# Patient Record
Sex: Female | Born: 1945 | Race: White | Hispanic: No | Marital: Married | Smoking: Never smoker
Health system: Southern US, Community
[De-identification: ages and names within clinical notes are randomized; demographics above are authoritative.]

## PROBLEM LIST (undated history)

## (undated) DIAGNOSIS — I4891 Unspecified atrial fibrillation: Secondary | ICD-10-CM

## (undated) DIAGNOSIS — M810 Age-related osteoporosis without current pathological fracture: Secondary | ICD-10-CM

## (undated) DIAGNOSIS — D649 Anemia, unspecified: Secondary | ICD-10-CM

## (undated) DIAGNOSIS — M1990 Unspecified osteoarthritis, unspecified site: Secondary | ICD-10-CM

## (undated) DIAGNOSIS — I499 Cardiac arrhythmia, unspecified: Secondary | ICD-10-CM

## (undated) HISTORY — PX: DILATION AND CURETTAGE OF UTERUS: SHX78

## (undated) HISTORY — PX: TONSILLECTOMY: SUR1361

## (undated) HISTORY — PX: FRACTURE SURGERY: SHX138

## (undated) HISTORY — PX: BREAST SURGERY: SHX581

## (undated) HISTORY — PX: LAPAROSCOPIC OOPHERECTOMY: SHX6507

---

## 2015-02-17 ENCOUNTER — Emergency Department: Payer: Medicare Other

## 2015-02-17 ENCOUNTER — Observation Stay
Admission: EM | Admit: 2015-02-17 | Discharge: 2015-02-20 | Disposition: A | Discharge status: Home / Self Care | Payer: Medicare Other | Attending: Orthopedic Surgery | Admitting: Orthopedic Surgery

## 2015-02-17 ENCOUNTER — Encounter: Payer: Self-pay | Admitting: Emergency Medicine

## 2015-02-17 DIAGNOSIS — G8918 Other acute postprocedural pain: Secondary | ICD-10-CM

## 2015-02-17 DIAGNOSIS — S0083XA Contusion of other part of head, initial encounter: Secondary | ICD-10-CM | POA: Diagnosis present

## 2015-02-17 DIAGNOSIS — I10 Essential (primary) hypertension: Secondary | ICD-10-CM | POA: Insufficient documentation

## 2015-02-17 DIAGNOSIS — S82001A Unspecified fracture of right patella, initial encounter for closed fracture: Secondary | ICD-10-CM

## 2015-02-17 DIAGNOSIS — S52022A Displaced fracture of olecranon process without intraarticular extension of left ulna, initial encounter for closed fracture: Principal | ICD-10-CM | POA: Insufficient documentation

## 2015-02-17 DIAGNOSIS — W19XXXA Unspecified fall, initial encounter: Secondary | ICD-10-CM | POA: Diagnosis not present

## 2015-02-17 DIAGNOSIS — M25531 Pain in right wrist: Secondary | ICD-10-CM

## 2015-02-17 DIAGNOSIS — I4891 Unspecified atrial fibrillation: Secondary | ICD-10-CM | POA: Diagnosis not present

## 2015-02-17 DIAGNOSIS — Z7982 Long term (current) use of aspirin: Secondary | ICD-10-CM | POA: Insufficient documentation

## 2015-02-17 DIAGNOSIS — R040 Epistaxis: Secondary | ICD-10-CM | POA: Insufficient documentation

## 2015-02-17 DIAGNOSIS — Z419 Encounter for procedure for purposes other than remedying health state, unspecified: Secondary | ICD-10-CM

## 2015-02-17 DIAGNOSIS — S52023A Displaced fracture of olecranon process without intraarticular extension of unspecified ulna, initial encounter for closed fracture: Secondary | ICD-10-CM | POA: Diagnosis present

## 2015-02-17 HISTORY — DX: Age-related osteoporosis without current pathological fracture: M81.0

## 2015-02-17 NOTE — ED Notes (Signed)
Pt to triage via w/c with no distress noted; reports PTA tripped over dogleash, falling; c/o pain to left elbow, right knee; small area bruising to nose--denies pain

## 2015-02-17 NOTE — ED Provider Notes (Signed)
[facility] Emergency Department Provider Note  ____________________________________________  Time seen: Approximately 11:14 PM  I have reviewed the triage vital signs and the nursing notes.   HISTORY  Chief Complaint Fall    HPI Rebecca Page is a 70 y.o. female presents for evaluation of multiple complaints secondary to a fall prior to arrival. Patient reports that she tripped over her dog leash landing on her left elbow, right knee, and hitting her face. Reports that her nose bled copiously and has some bruising around the nose.   Past Medical History  Diagnosis Date  . Osteoporosis     There are no active problems to display for this patient.   Past Surgical History  Procedure Laterality Date  . Laparoscopic oopherectomy      No current outpatient prescriptions on file.  Allergies Gluten meal  No family history on file.  Social History Social History  Substance Use Topics  . Smoking status: Never Smoker   . Smokeless tobacco: Not on file  . Alcohol Use: No    Review of Systems Constitutional: No fever/chills Eyes: No visual changes. ENT: No sore throat. Cardiovascular: Denies chest pain. Respiratory: Denies shortness of breath. Gastrointestinal: No abdominal pain.  No nausea, no vomiting.  No diarrhea.  No constipation. Genitourinary: Negative for dysuria. Musculoskeletal: Negative for back pain. Skin: Negative for rash. Neurological: Negative for headaches, focal weakness or numbness.  10-point ROS otherwise negative.  ____________________________________________   PHYSICAL EXAM:  VITAL SIGNS: ED Triage Vitals  Enc Vitals Group     BP 02/17/15 2226 170/73 mmHg     Pulse Rate 02/17/15 2226 86     Resp 02/17/15 2226 18     Temp 02/17/15 2226 98.1 F (36.7 C)     Temp Source 02/17/15 2226 Oral     SpO2 02/17/15 2226 98 %     Weight 02/17/15 2226 112 lb (50.803 kg)     Height 02/17/15 2226 5\' 2"  (1.575 m)     Head  Cir --      Peak Flow --      Pain Score 02/17/15 2225 8     Pain Loc --      Pain Edu? --      Excl. in GC? --     Constitutional: Alert and oriented. Well appearing and in no acute distress. Eyes: Conjunctivae are normal. PERRL. EOMI. Head: Facial trauma noted. Nose: Positive edema with ecchymosis surrounding the nasal bridge. Minimal tenderness positive dried blood noted within the naris left greater than right. Mouth/Throat: Mucous membranes are moist.  Oropharynx non-erythematous. Neck: No stridor. Nontender to palpation cervically.  Cardiovascular: Normal rate, regular rhythm. Grossly normal heart sounds.  Good peripheral circulation. Respiratory: Normal respiratory effort.  No retractions. Lungs CTAB Musculoskeletal: Left elbow with edema and mild deformity. Point tenderness noted. Limited range of motion of the forearm. Right knee with point tenderness both medial and lateral aspect. No ecchymosis or edema noted. Able to ambulate however with a limp. Distal neurovascularly intact left upper extremity and right lower extremity. Neurologic:  Normal speech and language. No gross focal neurologic deficits are appreciated. No gait instability. Skin:  Skin is warm, dry and intact. No rash noted. Psychiatric: Mood and affect are normal. Speech and behavior are normal.  ____________________________________________   LABS (all labs ordered are listed, but only abnormal results are displayed)  Labs Reviewed - No data to display ____________________________________________   RADIOLOGY  Maxillofacial CT:      1. IMPRESSION:  No definite acute fracture.   FINDINGS: The nondisplaced fractures of the patella. No other fracture identified. There is no dislocation. The bones are osteopenic. Small suprapatellar effusion. Soft tissues unremarkable.      2. IMPRESSION: Nondisplaced patellar fractures.  FINDINGS: The bones are osteopenic. There is a displaced fracture of  the olecranon process the ulna. The visualized radius and humerus appear unremarkable. There is soft tissue swelling and hematoma of the posterior elbow. There is elevation of the anterior and posterior fat pad compatible with joint effusion.      3. IMPRESSION: Displaced fracture of the olecranon process of the ulna.   ____________________________________________   PROCEDURES  Procedure(s) performed: None  Critical Care performed: No  ____________________________________________   INITIAL IMPRESSION / ASSESSMENT AND PLAN / ED COURSE  Pertinent labs & imaging results that were available during my care of the patient were reviewed by me and considered in my medical decision making (see chart for details).  Olecranon fracture and patella fracture. Spoke with attending orthopedic on call, Dr. Rosita Kea. Patient to be admitted with orders written by hospitalist. ____________________________________________   FINAL CLINICAL IMPRESSION(S) / ED DIAGNOSES  Final diagnoses:  Patella fracture, right, closed, initial encounter  Olecranon fracture, left, closed, initial encounter  Facial contusion, initial encounter      Evangeline Dakin, PA-C 02/18/15 1610  Sharyn Creamer, MD 02/18/15 [phone]

## 2015-02-18 ENCOUNTER — Observation Stay: Payer: Medicare Other | Admitting: Anesthesiology

## 2015-02-18 ENCOUNTER — Encounter
Admission: EM | Disposition: A | Discharge status: Home / Self Care | Payer: Self-pay | Source: Home / Self Care | Attending: Orthopedic Surgery

## 2015-02-18 ENCOUNTER — Observation Stay: Payer: Medicare Other

## 2015-02-18 ENCOUNTER — Inpatient Hospital Stay: Payer: Medicare Other

## 2015-02-18 DIAGNOSIS — S52022A Displaced fracture of olecranon process without intraarticular extension of left ulna, initial encounter for closed fracture: Secondary | ICD-10-CM | POA: Diagnosis not present

## 2015-02-18 DIAGNOSIS — S52023A Displaced fracture of olecranon process without intraarticular extension of unspecified ulna, initial encounter for closed fracture: Secondary | ICD-10-CM | POA: Diagnosis present

## 2015-02-18 HISTORY — PX: ORIF ELBOW FRACTURE: SHX5031

## 2015-02-18 LAB — CBC
HCT: 38.2 % (ref 35.0–47.0)
HEMOGLOBIN: 12.9 g/dL (ref 12.0–16.0)
MCH: 31.1 pg (ref 26.0–34.0)
MCHC: 33.8 g/dL (ref 32.0–36.0)
MCV: 92 fL (ref 80.0–100.0)
PLATELETS: 134 10*3/uL — AB (ref 150–440)
RBC: 4.15 MIL/uL (ref 3.80–5.20)
RDW: 13 % (ref 11.5–14.5)
WBC: 7.5 10*3/uL (ref 3.6–11.0)

## 2015-02-18 LAB — BASIC METABOLIC PANEL
Anion gap: 7 (ref 5–15)
BUN: 22 mg/dL — AB (ref 6–20)
CALCIUM: 8.7 mg/dL — AB (ref 8.9–10.3)
CHLORIDE: 107 mmol/L (ref 101–111)
CO2: 26 mmol/L (ref 22–32)
CREATININE: 0.49 mg/dL (ref 0.44–1.00)
GFR calc non Af Amer: >60 mL/min (ref >60)
Glucose, Bld: 121 mg/dL — ABNORMAL HIGH (ref 65–99)
Potassium: 3.8 mmol/L (ref 3.5–5.1)
SODIUM: 140 mmol/L (ref 135–145)

## 2015-02-18 LAB — SURGICAL PCR SCREEN
MRSA, PCR: NEGATIVE
Staphylococcus aureus: NEGATIVE

## 2015-02-18 SURGERY — OPEN REDUCTION INTERNAL FIXATION (ORIF) ELBOW/OLECRANON FRACTURE
Anesthesia: General | Laterality: Left

## 2015-02-18 MED ORDER — VITAMIN D 1000 UNITS PO TABS
1000.0000 [IU] | ORAL_TABLET | Freq: Every day | ORAL | Status: DC
  Administered 2015-02-18 – 2015-02-20 (×3): 1000 [IU] via ORAL
  Filled 2015-02-18 (×3): qty 1

## 2015-02-18 MED ORDER — ACETAMINOPHEN 650 MG RE SUPP: 650.0000 mg | Freq: Four times a day (QID) | RECTAL | Status: DC | PRN

## 2015-02-18 MED ORDER — DEXAMETHASONE SODIUM PHOSPHATE 10 MG/ML IJ SOLN
INTRAMUSCULAR | Status: DC | PRN
  Administered 2015-02-18: 10 mg via INTRAVENOUS

## 2015-02-18 MED ORDER — FENTANYL CITRATE (PF) 100 MCG/2ML IJ SOLN
25.0000 ug | INTRAMUSCULAR | Status: AC | PRN
  Administered 2015-02-18 (×6): 25 ug via INTRAVENOUS

## 2015-02-18 MED ORDER — ACETAMINOPHEN 325 MG PO TABS
650.0000 mg | ORAL_TABLET | Freq: Four times a day (QID) | ORAL | Status: DC | PRN
  Administered 2015-02-19 – 2015-02-20 (×3): 650 mg via ORAL
  Filled 2015-02-18 (×3): qty 2

## 2015-02-18 MED ORDER — FLECAINIDE ACETATE 50 MG PO TABS
75.0000 mg | ORAL_TABLET | Freq: Two times a day (BID) | ORAL | Status: DC
  Administered 2015-02-18 – 2015-02-19 (×4): 75 mg via ORAL
  Administered 2015-02-20: 100 mg via ORAL
  Filled 2015-02-18 (×5): qty 2

## 2015-02-18 MED ORDER — FLECAINIDE ACETATE 100 MG PO TABS: 300.0000 mg | ORAL_TABLET | Freq: Two times a day (BID) | ORAL | Status: DC

## 2015-02-18 MED ORDER — MORPHINE SULFATE (PF) 2 MG/ML IV SOLN
2.0000 mg | INTRAVENOUS | Status: DC | PRN
  Administered 2015-02-18 – 2015-02-19 (×3): 2 mg via INTRAVENOUS
  Filled 2015-02-18 (×3): qty 1

## 2015-02-18 MED ORDER — METOCLOPRAMIDE HCL 5 MG/ML IJ SOLN
5.0000 mg | Freq: Three times a day (TID) | INTRAMUSCULAR | Status: DC | PRN
  Administered 2015-02-19: 10 mg via INTRAVENOUS
  Filled 2015-02-18: qty 2

## 2015-02-18 MED ORDER — MIDAZOLAM HCL 2 MG/2ML IJ SOLN
INTRAMUSCULAR | Status: DC | PRN
  Administered 2015-02-18: 2 mg via INTRAVENOUS

## 2015-02-18 MED ORDER — FENTANYL CITRATE (PF) 100 MCG/2ML IJ SOLN
INTRAMUSCULAR | Status: AC
  Filled 2015-02-18: qty 2

## 2015-02-18 MED ORDER — LACTATED RINGERS IV SOLN
INTRAVENOUS | Status: DC | PRN
  Administered 2015-02-18: 18:00:00 via INTRAVENOUS

## 2015-02-18 MED ORDER — OXYCODONE HCL 5 MG PO TABS
5.0000 mg | ORAL_TABLET | ORAL | Status: DC | PRN
  Administered 2015-02-18 – 2015-02-19 (×2): 5 mg via ORAL
  Administered 2015-02-19: 10 mg via ORAL
  Filled 2015-02-18: qty 1
  Filled 2015-02-18: qty 2
  Filled 2015-02-18: qty 1

## 2015-02-18 MED ORDER — ONDANSETRON HCL 4 MG/2ML IJ SOLN
4.0000 mg | Freq: Four times a day (QID) | INTRAMUSCULAR | Status: DC | PRN
  Administered 2015-02-18 – 2015-02-19 (×2): 4 mg via INTRAVENOUS
  Filled 2015-02-18 (×3): qty 2

## 2015-02-18 MED ORDER — CEFAZOLIN SODIUM 1-5 GM-% IV SOLN
1.0000 g | Freq: Four times a day (QID) | INTRAVENOUS | Status: AC
  Administered 2015-02-19 (×3): 1 g via INTRAVENOUS
  Filled 2015-02-18 (×3): qty 50

## 2015-02-18 MED ORDER — FENTANYL CITRATE (PF) 100 MCG/2ML IJ SOLN
INTRAMUSCULAR | Status: DC | PRN
  Administered 2015-02-18 (×2): 50 ug via INTRAVENOUS

## 2015-02-18 MED ORDER — METOCLOPRAMIDE HCL 5 MG PO TABS: 5.0000 mg | ORAL_TABLET | Freq: Three times a day (TID) | ORAL | Status: DC | PRN

## 2015-02-18 MED ORDER — CEFAZOLIN SODIUM-DEXTROSE 2-3 GM-% IV SOLR
2.0000 g | INTRAVENOUS | Status: AC
  Administered 2015-02-18: 2 g via INTRAVENOUS
  Filled 2015-02-18: qty 50

## 2015-02-18 MED ORDER — SODIUM CHLORIDE 0.9 % IV SOLN
INTRAVENOUS | Status: DC
  Administered 2015-02-18 – 2015-02-20 (×4): via INTRAVENOUS

## 2015-02-18 MED ORDER — CEFAZOLIN SODIUM 1-5 GM-% IV SOLN: 1.0000 g | Freq: Once | INTRAVENOUS | Status: DC

## 2015-02-18 MED ORDER — ONDANSETRON HCL 4 MG/2ML IJ SOLN
INTRAMUSCULAR | Status: DC | PRN
  Administered 2015-02-18: 4 mg via INTRAVENOUS

## 2015-02-18 MED ORDER — ASPIRIN EC 81 MG PO TBEC
81.0000 mg | DELAYED_RELEASE_TABLET | Freq: Every day | ORAL | Status: DC
  Administered 2015-02-18 – 2015-02-20 (×3): 81 mg via ORAL
  Filled 2015-02-18 (×3): qty 1

## 2015-02-18 MED ORDER — SODIUM CHLORIDE 0.9 % IV BOLUS (SEPSIS)
1000.0000 mL | Freq: Once | INTRAVENOUS | Status: AC
  Administered 2015-02-18: 1000 mL via INTRAVENOUS

## 2015-02-18 MED ORDER — NEOMYCIN-POLYMYXIN B GU 40-200000 IR SOLN
Status: DC | PRN
  Administered 2015-02-18: 2 mL

## 2015-02-18 MED ORDER — ONDANSETRON HCL 4 MG/2ML IJ SOLN: 4.0000 mg | Freq: Once | INTRAMUSCULAR | Status: DC | PRN

## 2015-02-18 MED ORDER — PROPOFOL 10 MG/ML IV BOLUS
INTRAVENOUS | Status: DC | PRN
  Administered 2015-02-18: 160 mg via INTRAVENOUS

## 2015-02-18 MED ORDER — MORPHINE SULFATE (PF) 2 MG/ML IV SOLN
2.0000 mg | Freq: Once | INTRAVENOUS | Status: AC
  Administered 2015-02-18: 2 mg via INTRAVENOUS
  Filled 2015-02-18: qty 1

## 2015-02-18 MED ORDER — PHENYLEPHRINE HCL 10 MG/ML IJ SOLN
INTRAMUSCULAR | Status: DC | PRN
  Administered 2015-02-18 (×2): 100 ug via INTRAVENOUS
  Administered 2015-02-18: 50 ug via INTRAVENOUS

## 2015-02-18 SURGICAL SUPPLY — 55 items
BANDAGE ELASTIC 4 CLIP NS LF (GAUZE/BANDAGES/DRESSINGS) ×4 IMPLANT
BIT DRILL 2.5X2.75 QC CALB (BIT) ×2 IMPLANT
BIT DRILL CALIBRATED 2.7 (BIT) ×2 IMPLANT
BNDG COHESIVE 4X5 TAN STRL (GAUZE/BANDAGES/DRESSINGS) IMPLANT
BNDG ESMARK 4X12 TAN STRL LF (GAUZE/BANDAGES/DRESSINGS) ×2 IMPLANT
CANISTER SUCT 1200ML W/VALVE (MISCELLANEOUS) ×2 IMPLANT
CHLORAPREP W/TINT 26ML (MISCELLANEOUS) ×2 IMPLANT
CUFF TOURN SGL QUICK 18 (TOURNIQUET CUFF) ×2 IMPLANT
CUFF TOURN SGL QUICK 24 (TOURNIQUET CUFF)
CUFF TRNQT CYL 24X4X40X1 (TOURNIQUET CUFF) IMPLANT
DRAPE C-ARM XRAY 36X54 (DRAPES) ×2 IMPLANT
DRAPE SHEET LG 3/4 BI-LAMINATE (DRAPES) ×2 IMPLANT
ELECT CAUTERY BLADE 6.4 (BLADE) ×2 IMPLANT
GAUZE PETRO XEROFOAM 1X8 (MISCELLANEOUS) ×2 IMPLANT
GAUZE SPONGE 4X4 12PLY STRL (GAUZE/BANDAGES/DRESSINGS) ×2 IMPLANT
GLOVE BIO SURGEON STRL SZ7 (GLOVE) ×2 IMPLANT
GLOVE BIOGEL PI IND STRL 9 (GLOVE) ×1 IMPLANT
GLOVE BIOGEL PI INDICATOR 9 (GLOVE) ×1
GLOVE INDICATOR 7.5 STRL GRN (GLOVE) ×2 IMPLANT
GLOVE SURG ORTHO 9.0 STRL STRW (GLOVE) ×2 IMPLANT
GOWN SPECIALTY ULTRA XL (MISCELLANEOUS) ×2 IMPLANT
GOWN STRL REUS W/ TWL LRG LVL3 (GOWN DISPOSABLE) ×1 IMPLANT
GOWN STRL REUS W/TWL LRG LVL3 (GOWN DISPOSABLE) ×1
K-WIRE FIXATION 2.0X6 (WIRE) ×6
KIT RM TURNOVER STRD PROC AR (KITS) ×2 IMPLANT
KWIRE FIXATION 2.0X6 (WIRE) ×3 IMPLANT
NEEDLE FILTER BLUNT 18X 1/2SAF (NEEDLE) ×1
NEEDLE FILTER BLUNT 18X1 1/2 (NEEDLE) ×1 IMPLANT
NS IRRIG 500ML POUR BTL (IV SOLUTION) ×2 IMPLANT
PACK EXTREMITY ARMC (MISCELLANEOUS) ×2 IMPLANT
PAD ABD DERMACEA PRESS 5X9 (GAUZE/BANDAGES/DRESSINGS) ×4 IMPLANT
PAD CAST CTTN 4X4 STRL (SOFTGOODS) ×3 IMPLANT
PAD GROUND ADULT SPLIT (MISCELLANEOUS) ×2 IMPLANT
PAD PREP 24X41 OB/GYN DISP (PERSONAL CARE ITEMS) ×2 IMPLANT
PADDING CAST COTTON 4X4 STRL (SOFTGOODS) ×3
PLATE OLECRANON SM (Plate) ×2 IMPLANT
SCREW CORT 3.5X26 (Screw) ×1 IMPLANT
SCREW CORT T15 26X3.5XST LCK (Screw) ×1 IMPLANT
SCREW LOCK 3.5X20 DIST TIB (Screw) ×2 IMPLANT
SCREW LOCK CORT STAR 3.5X12 (Screw) ×2 IMPLANT
SCREW LOCK CORT STAR 3.5X20 (Screw) ×2 IMPLANT
SCREW LOCK CORT STAR 3.5X22 (Screw) ×4 IMPLANT
SCREW LOW PROFILE 22MMX3.5MM (Screw) ×2 IMPLANT
SPLINT CAST 1 STEP 5X30 WHT (MISCELLANEOUS) ×2 IMPLANT
SPONGE LAP 18X18 5 PK (GAUZE/BANDAGES/DRESSINGS) IMPLANT
STAPLER SKIN PROX 35W (STAPLE) ×2 IMPLANT
STOCKINETTE IMPERVIOUS 9X36 MD (GAUZE/BANDAGES/DRESSINGS) IMPLANT
STRIP CLOSURE SKIN 1/2X4 (GAUZE/BANDAGES/DRESSINGS) ×2 IMPLANT
SUT ETHILON 3-0 FS-10 30 BLK (SUTURE) ×2
SUT VIC AB 0 CT2 27 (SUTURE) ×2 IMPLANT
SUT VIC AB 2-0 CT2 27 (SUTURE) ×2 IMPLANT
SUTURE EHLN 3-0 FS-10 30 BLK (SUTURE) ×1 IMPLANT
SYR 5ML LL (SYRINGE) ×2 IMPLANT
TOWEL OR 17X26 4PK STRL BLUE (TOWEL DISPOSABLE) ×2 IMPLANT
WASHER 3.5MM (Orthopedic Implant) ×6 IMPLANT

## 2015-02-18 NOTE — Anesthesia Procedure Notes (Signed)
Procedure Name: LMA Insertion Date/Time: 02/18/2015 6:02 PM Performed by: Omer JackWEATHERLY, Carla Whilden Pre-anesthesia Checklist: Patient identified, Patient being monitored, Timeout performed, Emergency Drugs available and Suction available Patient Re-evaluated:Patient Re-evaluated prior to inductionOxygen Delivery Method: Circle system utilized Preoxygenation: Pre-oxygenation with 100% oxygen Intubation Type: IV induction Ventilation: Mask ventilation without difficulty LMA: LMA inserted LMA Size: 3.0 Tube type: Oral Number of attempts: 1 Placement Confirmation: positive ETCO2 and breath sounds checked- equal and bilateral Tube secured with: Tape Dental Injury: Teeth and Oropharynx as per pre-operative assessment

## 2015-02-18 NOTE — Care Management Note (Signed)
Case Management Note  Patient Details  Name: Rebecca Page MRN: [id number] Date of Birth: Jun 19, 1945  Subjective/Objective:                  Met with patient and her daughter to discuss discharge planning. Right side hemi-walker ordered and delivered from [facility] with [facility]. Daughter states that she will be available to assist patient at discharge. PT is currently recommending HHPT with potential for outpatient PT.   Action/Plan: List of home health agencies left with patient. RNCM will continue to follow.   Expected Discharge Date:                  Expected Discharge Plan:     In-House Referral:     Discharge planning Services     Post Acute Care Choice:  Durable Medical Equipment, Home Health Choice offered to:  Patient  DME Arranged:  Other see comment (right side hemi-walker) DME Agency:     HH Arranged:    HH Agency:     Status of Service:  In process, will continue to follow  Medicare Important Message Given:    Date Medicare IM Given:    Medicare IM give by:    Date Additional Medicare IM Given:    Additional Medicare Important Message give by:     If discussed at [place] of Stay Meetings, dates discussed:    Additional Comments:  Marshell Garfinkel, RN 02/18/2015, 2:55 PM

## 2015-02-18 NOTE — ED Notes (Signed)
[facility] family member at bedside. Pt denies any needs at this time.

## 2015-02-18 NOTE — Evaluation (Signed)
Physical Therapy Evaluation Patient Details Name: Rebecca Page MRN: [id number] DOB: [birth year] Today's Date: 02/18/2015   History of Present Illness  Pt is a 70 y.o. female s/p fall tripping over dog leash.  Pt sustained R knee nondisplaced patellar fx's (non-op in KI) and L displaced fx of olecranon process (plan for ORIF later today; currently in sling); R wrist imaging negative for fx.  MD Rosita Kea requesting PT to start today (prior to surgery this afternoon) and per verbal discussion with MD Rosita Kea, pt is WBAT R LE (in KI at all times), NWB L UE, and to use hemi-walker on R.    Clinical Impression  Prior to admission, pt was independent without AD.  Pt is from [place] and is currently staying in a small cottage on her daughter's property (pt's husband is flying in tomorrow to stay with pt and help).  Leanor Kail is 1 level with 3 steps to enter no railing.  Pt's daughter reports pt will have 24/7 assist from family upon discharge.  Currently pt is min assist with supine to sit, CGA to stand, and min assist to ambulate 8 feet with hemi-walker (limited distance d/t pt getting very hot and feeling like she would pass out;  Thermostat in room noted to be on high temp so turned down; VS stable).  Plan for surgery (ORIF L olecranon fx) later today.  Will need new order for PT post-op.  Pt would benefit from skilled PT to address noted impairments and functional limitations.  Recommend pt discharge to daughter's home with 24/7 assist and use of hemi-walker when medically appropriate.     Follow Up Recommendations Home health PT (pending pt's progress)    Equipment Recommendations   (hemi-walker)    Recommendations for Other Services       Precautions / Restrictions Precautions Precautions: Fall Precaution Comments: Use hemi-walker on R Required Braces or Orthoses: Knee Immobilizer - Right Knee Immobilizer - Right: On at all times Restrictions Weight Bearing Restrictions: Yes LUE Weight Bearing: Non  weight bearing RLE Weight Bearing: Weight bearing as tolerated Other Position/Activity Restrictions: WBAT R LE in KI      Mobility  Bed Mobility Overal bed mobility: Needs Assistance Bed Mobility: Supine to Sit     Supine to sit: Min assist;HOB elevated     General bed mobility comments: assist for R LE; use of siderail; increased time and effort to perform and vc's for technique required  Transfers Overall transfer level: Needs assistance Equipment used: Hemi-walker Transfers: Sit to/from Stand Sit to Stand: Min guard         General transfer comment: vc's required for hand and feet placement and precautions but pt demonstrated strong stand without any loss of balance  Ambulation/Gait Ambulation/Gait assistance: Min assist Ambulation Distance (Feet): 8 Feet Assistive device: Hemi-walker   Gait velocity: decreased   General Gait Details: initially very small steps but with vc's for stepping technique and hemiwalker use pt performing step to pattern; decreased stance time R LE; distance limited d/t pt reporting getting very hot and feeling like she would pass out  Stairs            Wheelchair Mobility    Modified Rankin (Stroke Patients Only)       Balance Overall balance assessment: Needs assistance Sitting-balance support: Single extremity supported;Feet supported Sitting balance-Leahy Scale: Good     Standing balance support: Single extremity supported (on hemi-walker) Standing balance-Leahy Scale: Good  Pertinent Vitals/Pain Pain Assessment: 0-10 Pain Score: 6  Pain Location: R knee and L elbow Pain Descriptors / Indicators: Aching;Sore;Tender Pain Intervention(s): Limited activity within patient's tolerance;Monitored during session;Premedicated before session;Repositioned  Vitals stable and WFL throughout treatment session.    Home Living Family/patient expects to be discharged to:: Private  residence Living Arrangements: Spouse/significant other Available Help at Discharge: Family Type of Home:  ([place] and is staying in a cottage on her daughter's property and her husband is flying in tomorrow to be with pt) Home Access: Stairs to enter Entrance Stairs-Rails: None Entrance Stairs-Number of Steps: 2 plus 1 Home Layout: One level Home Equipment: None      Prior Function Level of Independence: Independent               Hand Dominance        Extremity/Trunk Assessment   Upper Extremity Assessment: LUE deficits/detail;RUE deficits/detail RUE Deficits / Details: R UE strength and ROM WFL     LUE Deficits / Details: deferred d/t L olecranon process fx and in sling waiting for surgery   Lower Extremity Assessment: RLE deficits/detail;LLE deficits/detail RLE Deficits / Details: at least 3+/5 with L ankle DF/PF AROM; rest of R LE deferred d/t in immobilizer LLE Deficits / Details: L LE strength and ROM WFL  Cervical / Trunk Assessment: Normal  Communication   Communication: No difficulties  Cognition Arousal/Alertness: Awake/alert Behavior During Therapy: WFL for tasks assessed/performed Overall Cognitive Status: Within Functional Limits for tasks assessed                      General Comments General comments (skin integrity, edema, etc.): R knee immobilizer and R UE sling in place  Nursing cleared pt for participation in physical therapy (nursing called PT to notify PT that PT consult was in place for pt to be seen today).  Pt agreeable to PT session.  Pt's daughter present during session.  Discussed pt with MD Rosita KeaMenz (over phone) who reports current KI on pt is appropriate for pt to use and requesting PT to see pt today (prior to planned surgery this afternoon).    Exercises  Extra time and vc's/demo and education required regarding WB'ing precautions and technique with functional mobility.      Assessment/Plan    PT Assessment Patient  needs continued PT services  PT Diagnosis Difficulty walking;Acute pain   PT Problem List Decreased strength;Decreased range of motion;Decreased activity tolerance;Decreased balance;Decreased mobility;Decreased knowledge of use of DME;Decreased knowledge of precautions;Pain  PT Treatment Interventions DME instruction;Gait training;Stair training;Functional mobility training;Therapeutic activities;Therapeutic exercise;Balance training;Patient/family education   PT Goals (Current goals can be found in the Care Plan section) Acute Rehab PT Goals Patient Stated Goal: to go back to daughter's home (cottage on daughter's property) PT Goal Formulation: With patient/family Time For Goal Achievement: 03/04/15 Potential to Achieve Goals: Good    Frequency BID   Barriers to discharge        Co-evaluation               End of Session Equipment Utilized During Treatment: Gait belt;Right knee immobilizer;Other (comment) (L UE sling) Activity Tolerance: Patient limited by pain Patient left: in chair;with call bell/phone within reach;with chair alarm set;with family/visitor present Nurse Communication: Mobility status;Precautions;Weight bearing status         Time: [id number] PT Time Calculation (min) (ACUTE ONLY): 42 min   Charges:   PT Evaluation $PT Eval Moderate Complexity: 1 Procedure PT Treatments $Therapeutic  Activity: 8-22 mins   PT G CodesHendricks Limes 2015-03-06, 12:11 PM Hendricks Limes, PT [phone]

## 2015-02-18 NOTE — Care Management Obs Status (Signed)
MEDICARE OBSERVATION STATUS NOTIFICATION   Patient Details  Name: Rebecca DoomGloria Mcelrath MRN: [id number] Date of Birth: 12/31/1945   Medicare Observation Status Notification Given:  Yes    Collie Siadngela Kenon Delashmit, RN 02/18/2015, 11:06 AM

## 2015-02-18 NOTE — Transfer of Care (Signed)
Immediate Anesthesia Transfer of Care Note  Patient: Rebecca Page  Procedure(s) Performed: Procedure(s): OPEN REDUCTION INTERNAL FIXATION (ORIF) ELBOW/OLECRANON FRACTURE (Left)  Patient Location: PACU  Anesthesia Type:General  Level of Consciousness: awake, alert , oriented and patient cooperative  Airway & Oxygen Therapy: Patient Spontanous Breathing and Patient connected to face mask oxygen  Post-op Assessment: Report given to RN and Post -op Vital signs reviewed and stable  Post vital signs: Reviewed and stable  Last Vitals:  Filed Vitals:   02/18/15 0723 02/18/15 1643  BP: 141/67 133/56  Pulse: 79 75  Temp: 36.9 C 36.8 C  Resp: 18 18    Complications: No apparent anesthesia complications

## 2015-02-18 NOTE — ED Notes (Signed)
Attempted to call report to floor at [phone]

## 2015-02-18 NOTE — Op Note (Signed)
02/17/2015 - 02/18/2015  7:44 PM  PATIENT:  Rebecca Page  70 y.o. female  PRE-OPERATIVE DIAGNOSIS:  fractured left elbow, olecranon, olecranon  POST-OPERATIVE DIAGNOSIS:  fractureed left elbow  PROCEDURE:  Procedure(s): OPEN REDUCTION INTERNAL FIXATION (ORIF) ELBOW/OLECRANON FRACTURE (Left)  SURGEON: Leitha SchullerMichael J Thea Holshouser, MD  ASSISTANTS: none  ANESTHESIA:   general  EBL:  Total I/O In: 500 [I.V.:500] Out: -   BLOOD ADMINISTERED:none  DRAINS: none   LOCAL MEDICATIONS USED:  NONE  SPECIMEN:  No Specimen  DISPOSITION OF SPECIMEN:  N/A  COUNTS:  YES  TOURNIQUET:   Total Tourniquet Time Documented: Upper Arm (Left) - 50 minutes Total: Upper Arm (Left) - 50 minutes   IMPLANTS:  BIOMET LOCKING OLECRANON PLATE 10 HOLE  DICTATION: .Dragon Dictation patient brought the operating room and after adequate anesthesia was obtained, the left arm was prepped and draped in sterile fashion. The patient identification and timeout procedures were completed sterile tourniquet was raised above the elbow. Tourniquet was raised to her 50 mmHg. A posterior approach was made curving to the radial side around the olecranon with the incision to the skin and subcutaneous tissues tissue. There was significant soft tissue disruption marked comminution of the fracture site. The ulnar subcutaneous tissues border was L was exposed with a periosteal elevator. The fracture site was irrigated there was significant amount of blood clot present illness was removed was clear there was comminution and impaction of the fracture and ulnar side was more anatomic and this was held reduced with a reduction clamp. K wires were used to provisionally fix the proximal ulna and a plate contoured to fit this. Locking screws were placed proximally and then a compression screw filled on the plate to aid in compression at the fracture site without overly compress in the joint. On fluoroscopic examination the joint appeared relatively  congruent with some bone loss present. Full extension and flexion could be obtained did not appear to be impingement of the metal and the joint. 3 distal screws were placed in total was 2 of them locking and again it did not appear to impinge on the joint with fluoroscopic views. After placing the screws the wound was thoroughly irrigated K wires removed and the elbow examined fluoroscopically and was stable to varus valgus as well as posterior and anterior subluxation to stress testing. All fast guides were removed and the wound irrigated prior to being closed with 2-0 Vicryl subcutaneously and skin staples. Xeroform 4 x 4's web roll and a posterior splint were applied followed by an Ace wrap tourniquet is let down prior to applying the dressing and there is minimal bleeding.   plan of  CARE: Continue as inpatient  PATIENT DISPOSITION:  PACU - hemodynamically stable.

## 2015-02-18 NOTE — Anesthesia Preprocedure Evaluation (Addendum)
Anesthesia Evaluation  Patient identified by MRN, date of birth, ID band Patient awake    Reviewed: Allergy & Precautions, H&P , NPO status , Patient's Chart, lab work & pertinent test results, reviewed documented beta blocker date and time   History of Anesthesia Complications Negative for: history of anesthetic complications  Airway Mallampati: II  TM Distance: >3 FB Neck ROM: full    Dental no notable dental hx. (+) Caps, Teeth Intact   Pulmonary neg pulmonary ROS,    Pulmonary exam normal breath sounds clear to auscultation       Cardiovascular Exercise Tolerance: Good (-) hypertension(-) angina(-) CAD, (-) Past MI, (-) Cardiac Stents and (-) CABG Normal cardiovascular exam+ dysrhythmias ("She does not remember which arrhythmia, but is on flecainide.  Could be SVT or atrial fibrillation as the most likely) (-) Valvular Problems/Murmurs Rhythm:regular Rate:Normal     Neuro/Psych negative neurological ROS  negative psych ROS   GI/Hepatic negative GI ROS, Neg liver ROS,   Endo/Other  negative endocrine ROS  Renal/GU negative Renal ROS  negative genitourinary   Musculoskeletal   Abdominal   Peds  Hematology negative hematology ROS (+)   Anesthesia Other Findings Past Medical History:   Osteoporosis                                                 Reproductive/Obstetrics negative OB ROS                             Anesthesia Physical Anesthesia Plan  ASA: II  Anesthesia Plan: General   Post-op Pain Management:    Induction:   Airway Management Planned:   Additional Equipment:   Intra-op Plan:   Post-operative Plan:   Informed Consent: I have reviewed the patients History and Physical, chart, labs and discussed the procedure including the risks, benefits and alternatives for the proposed anesthesia with the patient or authorized representative who has indicated his/her  understanding and acceptance.   Dental Advisory Given  Plan Discussed with: Anesthesiologist, CRNA and Surgeon  Anesthesia Plan Comments:         Anesthesia Quick Evaluation

## 2015-02-18 NOTE — Progress Notes (Signed)
Physical Therapy Treatment Patient Details Name: Rebecca DoomGloria Aronoff MRN: [id number] DOB: 03/21/1945 Today's Date: 02/18/2015    History of Present Illness Pt is a 70 y.o. female s/p fall tripping over dog leash.  Pt sustained R knee nondisplaced patellar fx's (non-op in KI) and L displaced fx of olecranon process (plan for ORIF later today; currently in sling); R wrist imaging negative for fx.  MD Rosita KeaMenz requesting PT to start today (prior to surgery this afternoon) and per verbal discussion with MD Rosita KeaMenz, pt is WBAT R LE (in KI at all times), NWB L UE, and to use hemi-walker on R.      PT Comments    Pt requesting to use bathroom and to ambulate to bathroom (instead of using bedside commode) upon PT arriving to pt's room.  Pt did fairly well using R hemi-walker ambulating to bathroom but pt reporting 4-5/10 R wrist pain (some swelling noted R wrist too--nursing notified and aware).  Plan for ORIF L olecranon later this afternoon (need new PT consult post-op).  Will continue to progress pt with functional mobility post-op as appropriate once new PT orders are received.   Follow Up Recommendations  Home health PT (pending pt's progress)     Equipment Recommendations   (hemi-walker)    Recommendations for Other Services       Precautions / Restrictions Precautions Precautions: Fall Precaution Comments: Use hemi-walker on R Required Braces or Orthoses: Knee Immobilizer - Right Restrictions Weight Bearing Restrictions: Yes LUE Weight Bearing: Non weight bearing RLE Weight Bearing: Weight bearing as tolerated    Mobility  Bed Mobility Overal bed mobility: Needs Assistance Bed Mobility: Sit to Supine       Sit to supine: Min assist;HOB elevated   General bed mobility comments: assist for R LE; use of siderail; increased time and effort to perform and vc's for technique required  Transfers Overall transfer level: Needs assistance Equipment used: Hemi-walker Transfers: Sit to/from  [employer]Stand;Stand Pivot Transfers Sit to Stand: Min guard Stand pivot transfers: Min guard (to toilet; to chair; to bed)       General transfer comment: occasional vc's required for hand and feet placement and precautions but pt demonstrated strong stand without any loss of balance  Ambulation/Gait Ambulation/Gait assistance: Min assist Ambulation Distance (Feet): 20 Feet Assistive device: Hemi-walker   Gait velocity: decreased   General Gait Details: initially very small steps but with vc's for stepping technique and hemiwalker use pt performing step to pattern; decreased stance time R LE; distance limited d/t pt reporting 4-5/10 R wrist pain (pt brought back from bathroom to bed via chair)   Stairs            Wheelchair Mobility    Modified Rankin (Stroke Patients Only)       Balance Overall balance assessment: Needs assistance Sitting-balance support: Single extremity supported;Feet supported Sitting balance-Leahy Scale: Good     Standing balance support: Single extremity supported (on hemi-walker) Standing balance-Leahy Scale: Good                      Cognition Arousal/Alertness: Awake/alert Behavior During Therapy: WFL for tasks assessed/performed Overall Cognitive Status: Within Functional Limits for tasks assessed                      Exercises      General Comments General comments (skin integrity, edema, etc.): R knee immobilizer and R UE sling in place  Vitals stable and WFL throughout treatment  session.      Pertinent Vitals/Pain Pain Assessment: 0-10 Pain Score: 5  Pain Location: R wrist; R knee; L elbow Pain Descriptors / Indicators: Aching;Sore;Tender Pain Intervention(s): Limited activity within patient's tolerance;Monitored during session;Premedicated before session;Repositioned;Ice applied    Home Living                      Prior Function            PT Goals (current goals can now be found in the care plan  section) Acute Rehab PT Goals Patient Stated Goal: to go back to daughter's home (cottage on daughter's property) PT Goal Formulation: With patient/family Time For Goal Achievement: 03/04/15 Potential to Achieve Goals: Good Progress towards PT goals: Progressing toward goals    Frequency  BID    PT Plan Current plan remains appropriate    Co-evaluation             End of Session Equipment Utilized During Treatment: Gait belt;Right knee immobilizer;Other (comment) (L UE sling) Activity Tolerance: Patient limited by pain Patient left: in bed;with call bell/phone within reach;with bed alarm set     Time: 1340-1419 PT Time Calculation (min) (ACUTE ONLY): 39 min  Charges:  $Gait Training: 8-22 mins $Therapeutic Activity: 23-37 mins                    G Codes:  Functional Assessment Tool Used: AM-PAC without stairs Functional Limitation: Mobility: Walking and moving around Mobility: Walking and Moving Around Current Status (Z6109): At least 40 percent but less than 60 percent impaired, limited or restricted Mobility: Walking and Moving Around Goal Status [phone]): At least 1 percent but less than 20 percent impaired, limited or restricted   Hendricks Limes 02/18/2015, 4:14 PM Hendricks Limes, PT [phone]

## 2015-02-18 NOTE — H&P (Signed)
Subjective:   Patient is a 70 y.o. female presents with right knee left elbow and right wrist pain. Onset of symptoms was abrupt starting 10 hours ago with unchanged course since that time. The pain is located anterior right knee posterior left elbow and around the right wrist. Patient describes the pain as sharp in the elbow and the aching in the right wrist continuous and rated as moderate and severe. Pain has been associated with with a fall at home she was holding a phone in her left hand and landed directly on the elbow as well as knee and face and in the emergency room did not complain about wrist pain but is having increased wrist pain since being admitted. Patient denies loss of consciousness. She is currently visiting her daughter and lives in [place]  Patient Active Problem List   Diagnosis Date Noted  . Olecranon fracture 02/18/2015   Past Medical History  Diagnosis Date  . Osteoporosis    intermittent atrial fibrillation Past Surgical History  Procedure Laterality Date  . Laparoscopic oopherectomy      Prescriptions prior to admission  Medication Sig Dispense Refill Last Dose  . aspirin EC 81 MG tablet Take 1 tablet by mouth daily.   02/17/2015 at Unknown time  . Cholecalciferol (VITAMIN D3) 1000 units CAPS Take 1,000 Units by mouth daily.   02/17/2015 at Unknown time  . flecainide (TAMBOCOR) 150 MG tablet Take 75 mg by mouth 2 (two) times daily.   02/17/2015 at Unknown time   Allergies  Allergen Reactions  . Gluten Meal Other (See Comments)    Extreme upset stomach and inflammation/ osteoporosis issues    Social History  Substance Use Topics  . Smoking status: Never Smoker   . Smokeless tobacco: Not on file  . Alcohol Use: No    History reviewed. No pertinent family history.  Review of Systems Pertinent items are noted in HPI.  Objective:   Patient Vitals for the past 8 hrs:  BP Temp Temp src Pulse Resp SpO2 Height Weight  02/18/15 0723 (!) 141/67 mmHg 98.5 F (36.9  C) Oral 79 18 99 % - -  02/18/15 0450 (!) 142/59 mmHg 98.4 F (36.9 C) Oral 78 18 99 % 5' 2.5" (1.588 m) 53.661 kg (118 lb 4.8 oz)  02/18/15 0349 (!) 127/59 mmHg - - 71 18 98 % - -          BP 141/67 mmHg  Pulse 79  Temp(Src) 98.5 F (36.9 C) (Oral)  Resp 18  Ht 5' 2.5" (1.588 m)  Wt 53.661 kg (118 lb 4.8 oz)  BMI 21.28 kg/m2  SpO2 99% General appearance: alert, cooperative and appears stated age Lungs: clear to auscultation bilaterally Heart: regular rate and rhythm, S1, S2 normal, no murmur, click, rub or gallop Extremities: Left elbow: There is swelling about the elbow with ecchymosis skin is intact, sensation intact to the hand and strong pulses. Right knee there is swelling about the anterior patella with diffuse tenderness but she is able to maintain extension against gravity. Skin is intact and distally neurovascularly intact. Right wrist: She is tender to palpation and percussion over the radial styloid and dorsum of the wrist with poor grip strength sensation intact   Data ReviewRadiology review: Completely displaced olecranon fracture with small avulsion at the coronoid process, significant osteopenia apparent on x-ray. Right patella has a comminuted fracture stellate appearance but not essentially nondisplaced. Wrist x-ray has been ordered on the right  Assessment:   Active Problems:  Olecranon fracture  Right patella fracture, possible right wrist fracture Plan:   ORIF with plate and screws left olecranon, plan on that later today. Risks benefits possible competitions discussed. Regarding her patella she is in a knee immobilizer and will need to use that for approximately 6 weeks but should be able to be treated nonoperatively. If wrist x-ray is negative for fracture will start her with physical therapy today with a hemiwalker.

## 2015-02-19 ENCOUNTER — Encounter: Payer: Self-pay | Admitting: Orthopedic Surgery

## 2015-02-19 DIAGNOSIS — S52022A Displaced fracture of olecranon process without intraarticular extension of left ulna, initial encounter for closed fracture: Secondary | ICD-10-CM | POA: Diagnosis not present

## 2015-02-19 NOTE — Evaluation (Signed)
Physical Therapy Re-Evaluation Patient Details Name: Fallen Crisostomo MRN: [id number] DOB: 1945/05/06 Today's Date: 02/19/2015   History of Present Illness  Pt is a 70 y.o. female s/p fall tripping over dog leash.  Pt sustained R knee nondisplaced patellar fx's (non-op in KI) and displaced L elbow/olecranon fx.  R wrist imaging negative for fx.  MD Rosita Kea requesting PT to start 02/18/15 (prior to surgery for L elbow/olecranon fx) and per verbal discussion with MD Rosita Kea 02/18/15, pt is WBAT R LE (in KI at all times), NWB L UE, and to use hemi-walker on R.   Pt now s/p ORIF L elbow/olecranon displaced fx 02/19/15 (in sling).  New PT consult received for PT (re-eval) post-op.  Clinical Impression  Pt seen for PT re-eval s/p surgical procedure.  Prior to admission, pt was independent without AD. Pt is from [place] and is currently staying in a small cottage on her daughter's property (pt's husband is flying in today to stay with pt and help). Leanor Kail is 1 level with 3 steps to enter no railing. Pt's daughter reports pt will have 24/7 assist from family upon discharge. Currently pt is min assist with supine to sit, CGA to stand, and min assist to ambulate 4 feet with hemi-walker (limited distance d/t pt very nauseas and with emesis).  Pt would benefit from skilled PT to address noted impairments and functional limitations. Recommend pt discharge to daughter's home with 24/7 assist and use of hemi-walker when medically appropriate.    Follow Up Recommendations Home health PT (pending pt's progress)    Equipment Recommendations   (hemi-walker)    Recommendations for Other Services       Precautions / Restrictions Precautions Precautions: Fall Precaution Comments: Use hemi-walker on R Required Braces or Orthoses: Knee Immobilizer - Right;Sling Knee Immobilizer - Right: On at all times Restrictions Weight Bearing Restrictions: Yes LUE Weight Bearing: Non weight bearing RLE Weight Bearing: Weight bearing  as tolerated Other Position/Activity Restrictions: WBAT R LE in KI; L UE sling on at all times      Mobility  Bed Mobility Overal bed mobility: Needs Assistance Bed Mobility: Supine to Sit     Supine to sit: Min assist;HOB elevated     General bed mobility comments: assist for R LE; use of siderail; increased time and effort to perform and vc's for technique required  Transfers Overall transfer level: Needs assistance Equipment used: Hemi-walker Transfers: Sit to/from [employer] Sit to Stand: Min guard Stand pivot transfers: Min guard (bed to recliner)       General transfer comment: occasional vc's required for hand and feet placement and precautions but pt demonstrated strong stand without any loss of balance  Ambulation/Gait Ambulation/Gait assistance: Min guard;Min assist Ambulation Distance (Feet): 4 Feet (commode to chair) Assistive device: Hemi-walker   Gait velocity: decreased   General Gait Details: decreased stance time R LE; distance limited d/t pt very nauseas; min vc's for gait technique required  Stairs            Wheelchair Mobility    Modified Rankin (Stroke Patients Only)       Balance Overall balance assessment: Needs assistance Sitting-balance support: Single extremity supported;Feet supported Sitting balance-Leahy Scale: Good     Standing balance support: Single extremity supported (on hemi-walker) Standing balance-Leahy Scale: Good                               Pertinent Vitals/Pain Pain  Assessment: 0-10 Pain Score: 8  (8/10 L elbow; 5/10 R knee) Pain Location: L elbow; R knee Pain Descriptors / Indicators: Aching;Sore;Tender;Operative site guarding Pain Intervention(s): Limited activity within patient's tolerance;Monitored during session;Premedicated before session;Repositioned  Vitals stable and WFL throughout treatment session.    Home Living Family/patient expects to be discharged to:: Private  residence Living Arrangements: Spouse/significant other Available Help at Discharge: Family Type of Home:  (pt is from [place] and is staying in a cottage on her daughter's property (pt's husband is flying in today to assist)) Home Access: Stairs to enter Entrance Stairs-Rails: None Entrance Stairs-Number of Steps: 2 plus 1 Home Layout: One level Home Equipment: None      Prior Function Level of Independence: Independent               Hand Dominance        Extremity/Trunk Assessment   Upper Extremity Assessment: RUE deficits/detail;LUE deficits/detail RUE Deficits / Details: R UE strength and ROM WFL     LUE Deficits / Details: deferred d/t L olecranon process fx and in sling s/p surgery   Lower Extremity Assessment: RLE deficits/detail;LLE deficits/detail RLE Deficits / Details: at least 3+/5 with L ankle DF/PF AROM; rest of R LE deferred d/t in immobilizer LLE Deficits / Details: L LE strength and ROM WFL  Cervical / Trunk Assessment: Normal  Communication   Communication: No difficulties  Cognition Arousal/Alertness: Awake/alert Behavior During Therapy: WFL for tasks assessed/performed Overall Cognitive Status: Within Functional Limits for tasks assessed                      General Comments General comments (skin integrity, edema, etc.): R knee immobilizer and L UE sling in place  Nursing cleared pt for participation in physical therapy.  PT contacted nursing and then MD regarding needing new PT consult s/p surgery yesterday (for PT re-eval) and new PT consult placed.  Pt agreeable to PT session but requesting to toilet first (pt's daughter also present during session).  Extra time and cueing required for therapeutic activity d/t c/o pain and nausea with emesis.     Exercises        Assessment/Plan    PT Assessment Patient needs continued PT services  PT Diagnosis Difficulty walking;Acute pain   PT Problem List Decreased strength;Decreased range  of motion;Decreased activity tolerance;Decreased balance;Decreased mobility;Decreased knowledge of use of DME;Decreased knowledge of precautions;Pain  PT Treatment Interventions DME instruction;Gait training;Stair training;Functional mobility training;Therapeutic activities;Therapeutic exercise;Balance training;Patient/family education   PT Goals (Current goals can be found in the Care Plan section) Acute Rehab PT Goals Patient Stated Goal: to go back to daughter's home (cottage on daughter's property) PT Goal Formulation: With patient/family Time For Goal Achievement: 03/05/15 Potential to Achieve Goals: Good    Frequency BID   Barriers to discharge        Co-evaluation               End of Session Equipment Utilized During Treatment: Gait belt;Right knee immobilizer;Other (comment) (L UE sling) Activity Tolerance: Patient limited by pain;Other (comment) (Limited d/t nausea and emesis) Patient left: in chair;with call bell/phone within reach;with chair alarm set;with family/visitor present Nurse Communication: Mobility status;Precautions;Weight bearing status    Functional Assessment Tool Used: AM-PAC without stairs Functional Limitation: Mobility: Walking and moving around Mobility: Walking and Moving Around Current Status (W1191): At least 40 percent but less than 60 percent impaired, limited or restricted Mobility: Walking and Moving Around Goal Status [phone]): At least  1 percent but less than 20 percent impaired, limited or restricted    Time: 1007-1048 PT Time Calculation (min) (ACUTE ONLY): 41 min   Charges:   PT Evaluation $PT Re-evaluation: 1 Procedure PT Treatments $Therapeutic Activity: 8-22 mins   PT G Codes:   PT G-Codes **NOT FOR INPATIENT CLASS** Functional Assessment Tool Used: AM-PAC without stairs Functional Limitation: Mobility: Walking and moving around Mobility: Walking and Moving Around Current Status (Z6109([id number]): At least 40 percent but less than 60  percent impaired, limited or restricted Mobility: Walking and Moving Around Goal Status [phone]([id number]): At least 1 percent but less than 20 percent impaired, limited or restricted    Hendricks Limesmily Ranger Petrich 02/19/2015, 1:28 PM Hendricks LimesEmily Lyn Joens, PT [phone]

## 2015-02-19 NOTE — Progress Notes (Signed)
Physical Therapy Treatment Patient Details Name: Rebecca Page MRN: [id number] DOB: 11/25/1945 Today's Date: 02/19/2015    History of Present Illness Pt is a 70 y.o. female s/p fall tripping over dog leash.  Pt sustained R knee nondisplaced patellar fx's (non-op in KI) and displaced L elbow/olecranon fx.  R wrist imaging negative for fx.  MD Rosita KeaMenz requesting PT to start 02/18/15 (prior to surgery for L elbow/olecranon fx) and per verbal discussion with MD Rosita KeaMenz 02/18/15, pt is WBAT R LE (in KI at all times), NWB L UE, and to use hemi-walker on R.   Pt now s/p ORIF L elbow/olecranon displaced fx 02/19/15 (in sling).  New PT consult received for PT (re-eval) post-op 02/19/15.    PT Comments    Pt able to progress to ambulating 60 feet with hemi-walker with CGA (limited d/t pt still feeling nauseas this afternoon but pt reporting minimal R knee pain with activity).  Pt progressing well with functional mobility in general and need to trial stairs tomorrow.  Plan for home with 24/7 assist and use of hemi-walker.     Follow Up Recommendations  Home health PT (pending pt's progress)     Equipment Recommendations   (hemi-walker)    Recommendations for Other Services       Precautions / Restrictions Precautions Precautions: Fall Precaution Comments: Use hemi-walker on R Required Braces or Orthoses: Knee Immobilizer - Right;Sling Knee Immobilizer - Right: On at all times Restrictions Weight Bearing Restrictions: Yes LUE Weight Bearing: Non weight bearing RLE Weight Bearing: Weight bearing as tolerated Other Position/Activity Restrictions: WBAT R LE in KI; L UE sling on at all times    Mobility  Bed Mobility Overal bed mobility: Needs Assistance Bed Mobility: Sit to Supine     Sit to supine: Min assist;HOB elevated   General bed mobility comments: assist for R LE; increased time and effort to perform  Transfers Overall transfer level: Needs assistance Equipment used:  Hemi-walker Transfers: Sit to/from [employer];Stand Pivot Transfers Sit to Stand: Min guard Stand pivot transfers: Min guard (to toilet)       General transfer comment: good hand and feet placement without vc's; good strong stand from chair but required grab bar to assist with standing from toilet  Ambulation/Gait Ambulation/Gait assistance: Min guard Ambulation Distance (Feet):  (20 feet to bathroom; 60 feet) Assistive device: Hemi-walker   Gait velocity: decreased   General Gait Details: mild decreased stance time R LE; distance limited d/t pt nauseas; min vc's for gait technique required   Stairs            Wheelchair Mobility    Modified Rankin (Stroke Patients Only)       Balance Overall balance assessment: Needs assistance Sitting-balance support: Single extremity supported;Feet supported Sitting balance-Leahy Scale: Good     Standing balance support: Single extremity supported (on hemi-walker) Standing balance-Leahy Scale: Good                      Cognition Arousal/Alertness: Awake/alert Behavior During Therapy: WFL for tasks assessed/performed Overall Cognitive Status: Within Functional Limits for tasks assessed                      Exercises      General Comments General comments (skin integrity, edema, etc.): R knee immobilizer and L UE sling in place  Nursing cleared pt for participation in physical therapy.  Pt agreeable to PT session. Pt requesting to go to bathroom beginning of  session.      Pertinent Vitals/Pain Pain Assessment: 0-10 Pain Score: 5  Pain Location: L elbow Pain Descriptors / Indicators: Aching;Sore;Tender;Operative site guarding Pain Intervention(s): Limited activity within patient's tolerance;Monitored during session;Premedicated before session;Repositioned  Vitals stable and WFL throughout treatment session.    Home Living Family/patient expects to be discharged to:: Private residence Living Arrangements:  Spouse/significant other Available Help at Discharge: Family Type of Home:  (pt is from [place] and is staying in a cottage on her daughter's property (pt's husband is flying in today to assist)) Home Access: Stairs to enter Entrance Stairs-Rails: None Home Layout: One level Home Equipment: None      Prior Function Level of Independence: Independent          PT Goals (current goals can now be found in the care plan section) Acute Rehab PT Goals Patient Stated Goal: to go back to daughter's home (cottage on daughter's property) PT Goal Formulation: With patient/family Time For Goal Achievement: 03/05/15 Potential to Achieve Goals: Good Progress towards PT goals: Progressing toward goals    Frequency  BID    PT Plan Current plan remains appropriate    Co-evaluation             End of Session Equipment Utilized During Treatment: Gait belt;Right knee immobilizer;Other (comment) (L UE sling) Activity Tolerance: Patient limited by pain;Other (comment) (Limited d/t nauseas) Patient left: in bed;with call bell/phone within reach;with bed alarm set     Time: 1502-1530 PT Time Calculation (min) (ACUTE ONLY): 28 min  Charges:  $Gait Training: 8-22 mins $Therapeutic Activity: 8-22 mins                    G Codes:  Functional Assessment Tool Used: AM-PAC without stairs Functional Limitation: Mobility: Walking and moving around Mobility: Walking and Moving Around Current Status (Z6109): At least 40 percent but less than 60 percent impaired, limited or restricted Mobility: Walking and Moving Around Goal Status [phone]): At least 1 percent but less than 20 percent impaired, limited or restricted   Hendricks Limes 02/19/2015, 5:10 PM Hendricks Limes, PT [phone]

## 2015-02-19 NOTE — Progress Notes (Signed)
   Subjective: 1 Day Post-Op Procedure(s) (LRB): OPEN REDUCTION INTERNAL FIXATION (ORIF) ELBOW/OLECRANON FRACTURE (Left) Patient reports pain as mild.   Patient is well, and has had no acute complaints or problems We will start therapy today.  Plan is to go Home after hospital stay.  Objective: Vital signs in last 24 hours: Temp:  [97.5 F (36.4 C)-98.6 F (37 C)] 98.2 F (36.8 C) (01/12 0559) Pulse Rate:  [67-80] 67 (01/12 0559) Resp:  [9-18] 18 (01/12 0559) BP: (121-163)/(56-85) 131/63 mmHg (01/12 0559) SpO2:  [95 %-100 %] 100 % (01/12 0559)  Intake/Output from previous day: 01/11 0701 - 01/12 0700 In: 600 [I.V.:600] Out: 825 [Urine:825] Intake/Output this shift: Total I/O In: 966.3 [I.V.:966.3] Out: -    Recent Labs  02/18/15 0239  HGB 12.9    Recent Labs  02/18/15 0239  WBC 7.5  RBC 4.15  HCT 38.2  PLT 134*    Recent Labs  02/18/15 0239  NA 140  K 3.8  CL 107  CO2 26  BUN 22*  CREATININE 0.49  GLUCOSE 121*  CALCIUM 8.7*   No results for input(s): LABPT, INR in the last 72 hours.  EXAM General - Patient is Alert, Appropriate and Oriented Extremity - Sensation intact distally Intact pulses distally both upper and lower extremities Dressing - dressing C/D/I and no drainage, knee immobilizer intact to RLE Motor Function - intact, moving foot and toes, fingers well on exam.   Past Medical History  Diagnosis Date  . Osteoporosis     Assessment/Plan:   1 Day Post-Op Procedure(s) (LRB): OPEN REDUCTION INTERNAL FIXATION (ORIF) ELBOW/OLECRANON FRACTURE (Left) Active Problems:   Olecranon fracture  Estimated body mass index is 21.28 kg/(m^2) as calculated from the following:   Height as of this encounter: 5' 2.5" (1.588 m).   Weight as of this encounter: 53.661 kg (118 lb 4.8 oz). Advance diet Up with therapy  Plan on discharge home tomorrow pending progress with PT.  DVT Prophylaxis - Aspirin Weight-Bearing as tolerated to right leg, non  weight bearing Left upper extremity D/C O2 and Pulse OX and try on Room Air  T. Cranston Neighborhris Zareena Willis, PA-C [facility] 02/19/2015, 7:44 AM

## 2015-02-19 NOTE — Discharge Instructions (Signed)
Diet: As you were doing prior to hospitalization   Shower:  May shower but keep the wounds dry, use an occlusive plastic wrap, NO SOAKING IN TUB.  If the bandage gets wet, change with a clean dry gauze.  Dressing:  You may change your dressing as needed. Change the dressing with sterile gauze dressing.    Activity:  Increase activity slowly as tolerated, but follow the weight bearing instructions below.  Weight Bearing:   Non weight bearing Left upper extremity. Weight bearing as tolerated right lower extremity.  To prevent constipation: you may use a stool softener such as -  Colace (over the counter) 100 mg by mouth twice a day  Drink plenty of fluids (prune juice may be helpful) and high fiber foods Miralax (over the counter) for constipation as needed.    Itching:  If you experience itching with your medications, try taking only a single pain pill, or even half a pain pill at a time.  You may take up to 10 pain pills per day, and you can also use benadryl over the counter for itching or also to help with sleep.   Precautions:  If you experience chest pain or shortness of breath - call 911 immediately for transfer to the hospital emergency department!!  If you develop a fever greater that 101 F, purulent drainage from wound, increased redness or drainage from wound, or calf pain-Call [facility]                                               Follow- Up Appointment:  Please call for an appointment to be [facility] in 3 days [facility]

## 2015-02-20 DIAGNOSIS — S52022A Displaced fracture of olecranon process without intraarticular extension of left ulna, initial encounter for closed fracture: Secondary | ICD-10-CM | POA: Diagnosis not present

## 2015-02-20 MED ORDER — OXYCODONE HCL 5 MG PO TABS: 5.0000 mg | ORAL_TABLET | ORAL | Status: DC | PRN

## 2015-02-20 MED ORDER — ONDANSETRON HCL 4 MG PO TABS: 4.0000 mg | ORAL_TABLET | Freq: Three times a day (TID) | ORAL | Status: DC | PRN

## 2015-02-20 NOTE — Care Management (Signed)
Patient to discharge to her daughters home today. Hemiwalker has been delivered. No HHPT per Cranston Neighborhris Gaines PA but patient to work with PT prior to discharge today. No Lovenox. No RNCM needs. Case closed.

## 2015-02-20 NOTE — Progress Notes (Signed)
   Subjective: 2 Days Post-Op Procedure(s) (LRB): OPEN REDUCTION INTERNAL FIXATION (ORIF) ELBOW/OLECRANON FRACTURE (Left) Patient reports pain as mild.   Patient is well, and has had no acute complaints or problems We will start therapy today.  Plan is to go Home after hospital stay.  Objective: Vital signs in last 24 hours: Temp:  [97.7 F (36.5 C)-98.2 F (36.8 C)] 98.1 F (36.7 C) (01/13 0733) Pulse Rate:  [70-75] 71 (01/13 0733) Resp:  [18] 18 (01/13 0733) BP: (109-121)/(47-57) 121/57 mmHg (01/13 0733) SpO2:  [97 %-100 %] 99 % (01/13 0733)  Intake/Output from previous day: 01/12 0701 - 01/13 0700 In: 2747.5 [P.O.:240; I.V.:2507.5] Out: 851 [Urine:851] Intake/Output this shift:     Recent Labs  02/18/15 0239  HGB 12.9    Recent Labs  02/18/15 0239  WBC 7.5  RBC 4.15  HCT 38.2  PLT 134*    Recent Labs  02/18/15 0239  NA 140  K 3.8  CL 107  CO2 26  BUN 22*  CREATININE 0.49  GLUCOSE 121*  CALCIUM 8.7*   No results for input(s): LABPT, INR in the last 72 hours.  EXAM General - Patient is Alert, Appropriate and Oriented Extremity - Sensation intact distally Intact pulses distally both upper and lower extremities Dressing - dressing C/D/I and no drainage, knee immobilizer intact to RLE Motor Function - intact, moving foot and toes, fingers well on exam.   Past Medical History  Diagnosis Date  . Osteoporosis     Assessment/Plan:   2 Days Post-Op Procedure(s) (LRB): OPEN REDUCTION INTERNAL FIXATION (ORIF) ELBOW/OLECRANON FRACTURE (Left) Active Problems:   Olecranon fracture  Estimated body mass index is 21.28 kg/(m^2) as calculated from the following:   Height as of this encounter: 5' 2.5" (1.588 m).   Weight as of this encounter: 53.661 kg (118 lb 4.8 oz). Advance diet Up with therapy  Plan on discharge home today Follow up with [facility] in 3 days  DVT Prophylaxis - Aspirin Weight-Bearing as tolerated to right leg, non weight bearing  Left upper extremity D/C O2 and Pulse OX and try on Room Air  T. Cranston Neighborhris Janiyla Long, PA-C [facility] 02/20/2015, 8:24 AM

## 2015-02-20 NOTE — Progress Notes (Signed)
Physical Therapy Treatment Patient Details Name: Rebecca DoomGloria Page MRN: [id number] DOB: 03/10/1945 Today's Date: 02/20/2015    History of Present Illness Pt is a 70 y.o. female s/p fall tripping over dog leash.  Pt sustained R knee nondisplaced patellar fx's (non-op in KI) and displaced L elbow/olecranon fx.  R wrist imaging negative for fx.  MD Rosita KeaMenz requesting PT to start 02/18/15 (prior to surgery for L elbow/olecranon fx) and per verbal discussion with MD Rosita KeaMenz 02/18/15, pt is WBAT R LE (in KI at all times), NWB L UE, and to use hemi-walker on R.   Pt now s/p ORIF L elbow/olecranon displaced fx 02/19/15 (in sling).  New PT consult received for PT (re-eval) post-op 02/19/15.    PT Comments    Pt able to ambulate160 feet (x2) with hemi-walker SBA and navigate 4 stairs with R UE hand hold assist this morning.  Pt appears safe to discharge home with SBA for functional mobility (pt reports L LE weaker compared to R normally d/t polio as a child and has h/o falls) using hemi-walker (plus assist for stairs); pt reports having this assist available at home.  Plan for pt to discharge home today.  Recommend following up with ortho MD for future OP PT needs (when appropriate to initiate therapy for patellar fx's and R elbow/olecranon ORIF).   Follow Up Recommendations   (Follow-up with ortho MD for OP PT (when appropriate))     Equipment Recommendations   (hemi-walker fit to pt for home use)    Recommendations for Other Services       Precautions / Restrictions Precautions Precautions: Fall Precaution Comments: Use hemi-walker on R Required Braces or Orthoses: Knee Immobilizer - Right;Sling Knee Immobilizer - Right: On at all times Restrictions Weight Bearing Restrictions: Yes LUE Weight Bearing: Non weight bearing RLE Weight Bearing: Weight bearing as tolerated Other Position/Activity Restrictions: WBAT R LE in KI; L UE sling on at all times    Mobility  Bed Mobility Overal bed mobility: Modified  Independent Bed Mobility: Sit to Supine       Sit to supine: Modified independent (Device/Increase time)   General bed mobility comments: increased time and effort to perform but pt able to do without assist  Transfers Overall transfer level: Needs assistance Equipment used: Hemi-walker Transfers: Sit to/from Stand Sit to Stand: Supervision         General transfer comment: good hand and feet placement without vc's; good strong stand without loss of balance  Ambulation/Gait Ambulation/Gait assistance: Supervision Ambulation Distance (Feet):  (160 feet x2) Assistive device: Hemi-walker   Gait velocity: mildly decreased   General Gait Details: mild decreased stance time R LE; no vc's for gait technique required; steady without loss of balance   Stairs Stairs: Yes Stairs assistance: Min assist Stair Management: Step to pattern;Forwards (R UE hand hold assist) Number of Stairs: 4 General stair comments: initial demo and vc's for technique required and then pt did well without any vc's when performing stairs  Wheelchair Mobility    Modified Rankin (Stroke Patients Only)       Balance Overall balance assessment: Needs assistance Sitting-balance support: Single extremity supported;Feet supported Sitting balance-Leahy Scale: Normal     Standing balance support: Single extremity supported (on hemi-walker) Standing balance-Leahy Scale: Good                      Cognition Arousal/Alertness: Awake/alert Behavior During Therapy: WFL for tasks assessed/performed Overall Cognitive Status: Within Functional Limits for tasks  assessed                      Exercises      General Comments General comments (skin integrity, edema, etc.): R knee immobilizer and L EU sling in place  Nursing cleared pt for participation in physical therapy.  Pt agreeable to PT session.      Pertinent Vitals/Pain Pain Assessment: 0-10 Pain Score: 5  Pain Location: L  elbow Pain Descriptors / Indicators: Aching;Sore;Tender;Operative site guarding Pain Intervention(s): Limited activity within patient's tolerance;Monitored during session;Premedicated before session;Repositioned  Vitals stable and WFL throughout treatment session.    Home Living                      Prior Function            PT Goals (current goals can now be found in the care plan section) Acute Rehab PT Goals Patient Stated Goal: to go back to daughter's home ([place] on daughter's property) PT Goal Formulation: With patient/family Time For Goal Achievement: 03/05/15 Potential to Achieve Goals: Good Progress towards PT goals: Progressing toward goals    Frequency  BID    PT Plan Current plan remains appropriate    Co-evaluation             End of Session Equipment Utilized During Treatment: Gait belt;Right knee immobilizer;Other (comment) (L UE sling) Activity Tolerance: Patient tolerated treatment well Patient left: in bed;with call bell/phone within reach;with bed alarm set     Time: 1002-1040 PT Time Calculation (min) (ACUTE ONLY): 38 min  Charges:  $Gait Training: 23-37 mins $Therapeutic Activity: 8-22 mins                    G CodesHendricks Limes 02/27/2015, 11:11 AM Hendricks Limes, PT [phone]

## 2015-02-20 NOTE — Progress Notes (Signed)
Instructions reviewed with the pt. She is waiting on her ride to arrive

## 2015-02-20 NOTE — Discharge Summary (Signed)
Physician Discharge Summary  Patient ID: Rebecca Page MRN: [id number] DOB/AGE: 70/08/1945 70 y.o.  Admit date: 02/17/2015 Discharge date: 02/20/2015  Admission Diagnoses:  Facial contusion, initial encounter [S00.83XA] Olecranon fracture, left, closed, initial encounter [S52.022A] Patella fracture, right, closed, initial encounter [S82.001A]   Discharge Diagnoses: Patient Active Problem List   Diagnosis Date Noted  . Olecranon fracture 02/18/2015    Past Medical History  Diagnosis Date  . Osteoporosis      Transfusion: none   Consultants (if any):    Discharged Condition: Improved  Hospital Course: Rebecca DoomGloria Matkins is an 70 y.o. female who was admitted 02/17/2015 with a diagnosis of <principal problem not specified> and went to the operating room on 02/17/2015 - 02/18/2015 and underwent the above named procedures.    Surgeries: Procedure(s): OPEN REDUCTION INTERNAL FIXATION (ORIF) ELBOW/OLECRANON FRACTURE on 02/17/2015 - 02/18/2015 Patient tolerated the surgery well. Taken to PACU where she was stabilized and then transferred to the orthopedic floor.  Started on aspirin. Heels elevated on bed with rolled towels. No evidence of DVT. Negative Homan. Physical therapy started on day #1 for gait training and transfer. On post op day #2 patient was stable and ready for discharge to home.  Implants: BIOMET LOCKING OLECRANON PLATE 10 HOLE  She was given perioperative antibiotics:  Anti-infectives    Start     Dose/Rate Route Frequency Ordered Stop   02/19/15 0000  ceFAZolin (ANCEF) IVPB 1 g/50 mL premix     1 g 100 mL/hr over 30 Minutes Intravenous Every 6 hours 02/18/15 2046 02/19/15 1350   02/18/15 1715  ceFAZolin (ANCEF) IVPB 1 g/50 mL premix  Status:  Discontinued     1 g 100 mL/hr over 30 Minutes Intravenous  Once 02/18/15 0015 02/18/15 0235   02/18/15 0600  ceFAZolin (ANCEF) IVPB 2 g/50 mL premix     2 g 100 mL/hr over 30 Minutes Intravenous On call to O.R. 02/18/15 0235  02/18/15 1821    .  She was given sequential compression devices, early ambulation, and asprin for DVT prophylaxis.  She benefited maximally from the hospital stay and there were no complications.    Recent vital signs:  Filed Vitals:   02/20/15 0435 02/20/15 0733  BP: 109/48 121/57  Pulse: 70 71  Temp: 98.2 F (36.8 C) 98.1 F (36.7 C)  Resp: 18 18    Recent laboratory studies:  Lab Results  Component Value Date   HGB 12.9 02/18/2015   Lab Results  Component Value Date   WBC 7.5 02/18/2015   PLT 134* 02/18/2015   No results found for: INR Lab Results  Component Value Date   NA 140 02/18/2015   K 3.8 02/18/2015   CL 107 02/18/2015   CO2 26 02/18/2015   BUN 22* 02/18/2015   CREATININE 0.49 02/18/2015   GLUCOSE 121* 02/18/2015    Discharge Medications:     Medication List    TAKE these medications        aspirin EC 81 MG tablet  Take 1 tablet by mouth daily.     flecainide 150 MG tablet  Commonly known as:  TAMBOCOR  Take 75 mg by mouth 2 (two) times daily.     ondansetron 4 MG tablet  Commonly known as:  ZOFRAN  Take 1 tablet (4 mg total) by mouth every 8 (eight) hours as needed for nausea or vomiting.     oxyCODONE 5 MG immediate release tablet  Commonly known as:  Oxy IR/ROXICODONE  Take 1-2 tablets (  5-10 mg total) by mouth every 3 (three) hours as needed for breakthrough pain.     Vitamin D3 1000 units Caps  Take 1,000 Units by mouth daily.        Diagnostic Studies: Dg Chest 1 View  02/18/2015  CLINICAL DATA:  Hypertension. Left elbow fracture, right patellar fracture. EXAM: CHEST 1 VIEW COMPARISON:  None. FINDINGS: Anti lordotic positioning. The cardiomediastinal contours are normal. Mild biapical pleural parenchymal scarring. Pulmonary vasculature is normal. No consolidation, pleural effusion, or pneumothorax. No acute osseous abnormalities are seen. IMPRESSION: No acute process. Electronically Signed   By: Rubye Oaks M.D.   On:  02/18/2015 03:01   Dg Elbow 2 Views Left  02/19/2015  CLINICAL DATA:  Left ulnar fracture EXAM: LEFT ELBOW - 2 VIEW COMPARISON:  02/17/2015 FLUOROSCOPY TIME:  Radiation Exposure Index (as provided by the fluoroscopic device): Not available If the device does not provide the exposure index: Fluoroscopy Time:  30 seconds Number of Acquired Images:  2 FINDINGS: Fixation sideplate is now seen along the proximal ulna with multiple fixation screws. The fracture fragments are in near anatomic alignment. IMPRESSION: ORIF of proximal ulnar fracture Electronically Signed   By: Alcide Clever M.D.   On: 02/19/2015 08:46   Dg Elbow 2 Views Left  02/18/2015  CLINICAL DATA:  Status post open reduction internal fixation of olecranon fracture. EXAM: LEFT ELBOW - 2 VIEW COMPARISON:  Preoperative radiographs obtained yesterday. FINDINGS: Plate and multi screw fixation of olecranon fracture, in improved alignment. Posterior skin staples are seen. Recent postsurgical change includes subcutaneous air and soft tissue edema. Posterior splint material partially limits osseous and soft tissue fine detail. IMPRESSION: Improved alignment of olecranon fracture post ORIF. Electronically Signed   By: Rubye Oaks M.D.   On: 02/18/2015 20:20   Dg Elbow Complete Left  02/17/2015  CLINICAL DATA:  70 year old female with fall and left elbow pain. EXAM: LEFT ELBOW - COMPLETE 3+ VIEW COMPARISON:  None. FINDINGS: The bones are osteopenic. There is a displaced fracture of the olecranon process the ulna. The visualized radius and humerus appear unremarkable. There is soft tissue swelling and hematoma of the posterior elbow. There is elevation of the anterior and posterior fat pad compatible with joint effusion. IMPRESSION: Displaced fracture of the olecranon process of the ulna. Electronically Signed   By: Elgie Collard M.D.   On: 02/17/2015 23:47   Dg Wrist 2 Views Right  02/18/2015  CLINICAL DATA:  Acute right wrist pain.  No known  injury. EXAM: RIGHT WRIST - 2 VIEW COMPARISON:  None. FINDINGS: There is no evidence of fracture or dislocation. There is no evidence of arthropathy or other focal bone abnormality. Soft tissues are unremarkable. IMPRESSION: No acute osseous injury of the right wrist. Electronically Signed   By: Elige Ko   On: 02/18/2015 08:34   Dg Knee Complete 4 Views Right  02/17/2015  CLINICAL DATA:  70 year old female with fall and right knee pain. EXAM: RIGHT KNEE - COMPLETE 4+ VIEW COMPARISON:  None. FINDINGS: The nondisplaced fractures of the patella. No other fracture identified. There is no dislocation. The bones are osteopenic. Small suprapatellar effusion. Soft tissues unremarkable. IMPRESSION: Nondisplaced patellar fractures. Electronically Signed   By: Elgie Collard M.D.   On: 02/17/2015 23:44   Dg C-arm 1-60 Min-no Report  02/18/2015  CLINICAL DATA: surgery C-ARM 1-60 MINUTES Fluoroscopy was utilized by the requesting physician.  No radiographic interpretation.   Ct Maxillofacial Wo Cm  02/17/2015  CLINICAL DATA:  70 year old female with fall and nasal bleeding. EXAM: CT MAXILLOFACIAL WITHOUT CONTRAST TECHNIQUE: Multidetector CT imaging of the maxillofacial structures was performed. Multiplanar CT image reconstructions were also generated. A small metallic BB was placed on the right temple in order to reliably differentiate right from left. COMPARISON:  None. FINDINGS: There is slight irregularity of the nasal bridge. No displaced fracture identified. The maxilla, mandible, and pterygoid plates are intact. The globes, retro-orbital fat, and orbital walls are preserved. The paranasal sinuses and mastoid air cells are well aerated. The visualized portions of the brain appear unremarkable. There is minimal soft tissue swelling over the nose. IMPRESSION: No definite acute fracture. Electronically Signed   By: Elgie Collard M.D.   On: 02/17/2015 23:30    Disposition: Final discharge disposition not  confirmed        Follow-up Information    Follow up with MENZ,MICHAEL, MD In 3 days.   Specialty:  Orthopedic Surgery   Why:  For wound re-check   Contact information:   [address] [facility] [place] [address] [phone]        Signed: Patience Musca 02/20/2015, 9:20 AM

## 2015-02-20 NOTE — Anesthesia Postprocedure Evaluation (Signed)
Anesthesia Post Note  Patient: Rebecca Page  Procedure(s) Performed: Procedure(s) (LRB): OPEN REDUCTION INTERNAL FIXATION (ORIF) ELBOW/OLECRANON FRACTURE (Left)  Patient location during evaluation: PACU Anesthesia Type: General Level of consciousness: awake and alert Pain management: pain level controlled Vital Signs Assessment: post-procedure vital signs reviewed and stable Respiratory status: spontaneous breathing, nonlabored ventilation, respiratory function stable and patient connected to nasal cannula oxygen Cardiovascular status: blood pressure returned to baseline and stable Postop Assessment: no signs of nausea or vomiting Anesthetic complications: no    Last Vitals:  Filed Vitals:   02/20/15 0435 02/20/15 0733  BP: 109/48 121/57  Pulse: 70 71  Temp: 36.8 C 36.7 C  Resp: 18 18    Last Pain:  Filed Vitals:   02/20/15 0733  PainSc: 3                  Lenard SimmerAndrew Zamirah Denny

## 2015-04-22 ENCOUNTER — Ambulatory Visit: Payer: Medicare Other | Attending: Orthopedic Surgery | Admitting: Occupational Therapy

## 2015-04-22 DIAGNOSIS — M6281 Muscle weakness (generalized): Secondary | ICD-10-CM | POA: Diagnosis present

## 2015-04-22 DIAGNOSIS — M25622 Stiffness of left elbow, not elsewhere classified: Secondary | ICD-10-CM | POA: Diagnosis present

## 2015-04-22 DIAGNOSIS — M25642 Stiffness of left hand, not elsewhere classified: Secondary | ICD-10-CM

## 2015-04-22 DIAGNOSIS — M79632 Pain in left forearm: Secondary | ICD-10-CM | POA: Diagnosis present

## 2015-04-22 NOTE — Patient Instructions (Signed)
Heat to elbow and hand  AROM with gentle PROM for elbow flexion and extention against wall upper arm Wrist prayer stretch  AROM wrist flexion , ext, RD and UD   Digits AROM tendon glides

## 2015-04-22 NOTE — Therapy (Signed)
[facility] PHYSICAL AND SPORTS MEDICINE [address] [place], [place], [address] Phone: [phone]   Fax:  [phone]  Occupational Therapy Treatment  Patient Details  Name: Rebecca DoomGloria Page MRN: [id number] Date of Birth: 09/17/1945 Referring Provider: Dr Rosita KeaMenz  Encounter Date: 04/22/2015      OT End of Session - 04/22/15 1752    Visit Number 1   Number of Visits 12   Date for OT Re-Evaluation 06/03/15   OT Start Time 1230   OT Stop Time 1331   OT Time Calculation (min) 61 min   Activity Tolerance Patient tolerated treatment well   Behavior During Therapy [facility] for tasks assessed/performed      Past Medical History  Diagnosis Date  . Osteoporosis     Past Surgical History  Procedure Laterality Date  . Laparoscopic oopherectomy    . Orif elbow fracture Left 02/18/2015    Procedure: OPEN REDUCTION INTERNAL FIXATION (ORIF) ELBOW/OLECRANON FRACTURE;  Surgeon: Kennedy BuckerMichael Menz, MD;  Location: [facility] ORS;  Service: Orthopedics;  Laterality: Left;    There were no vitals filed for this visit.  Visit Diagnosis:  Stiffness of hand joint, left  Muscle weakness  Stiffness of elbow joint, left  Pain of left forearm      Subjective Assessment - 04/22/15 1719    Subjective  I fell walking the dogs , my hand and wrist were really swollen and bruised - and did not bother me but lately still bothering me - MD send me to you    Patient Stated Goals I want to use my L arm again and hand - like sewing, knitting, volunteer at animal rescue , play with grand kids , gardening    Currently in Pain? Yes   Pain Location Arm   Pain Orientation Left   Pain Descriptors / Indicators Aching;Stabbing;Burning   Pain Onset More than a month ago            [facility] OT Assessment - 04/22/15 0001    Assessment   Diagnosis L elbow ORIF with hand /wrist stiffness   Referring Provider Dr Rosita KeaMenz   Onset Date 02/18/15   Assessment Pt had ORIF , was in splint for about 4  wks and then still wearing when between people sling - refer to OT for ROM at hand , wrist and elbow - present with pain , stiffness and decrease strength    Restrictions   Other Position/Activity Restrictions     Home  Environment   Lives With Family   Prior Function   Level of Independence Independent   Vocation Retired   Leisure Pt is R hand dominant - Careers adviservolunteer animal  rescue , pt has dogs , like knitting , sewing,  grandkids, gardening    AROM   Left Elbow Flexion 90   Left Elbow Extension -34   Left Forearm Pronation 80 Degrees   Left Forearm Supination 90 Degrees   Right Wrist Extension 65 Degrees   Right Wrist Flexion 95 Degrees   Right Wrist Radial Deviation 30 Degrees   Right Wrist Ulnar Deviation 40 Degrees   Left Wrist Extension 34 Degrees   Left Wrist Flexion 60 Degrees   Left Wrist Radial Deviation 10 Degrees   Left Wrist Ulnar Deviation 20 Degrees   Left Hand AROM   L Index  MCP 0-90 80 Degrees   L Index PIP 0-100 95 Degrees   L Long  MCP 0-90 85 Degrees   L Long PIP 0-100 95  Degrees  -30   L Ring  MCP 0-90 85 Degrees   L Ring PIP 0-100 100 Degrees   L Little  MCP 0-90 80 Degrees   L Little PIP 0-100 100 Degrees        pt educated on HEP for ROM to elbow , wrist and hand  See hand out                   OT Education - 04/22/15 1752    Education provided Yes   Education Details HEP    Person(s) Educated Patient   Methods Explanation;Demonstration;Tactile cues;Verbal cues;Handout   Comprehension Verbal cues required;Returned demonstration          OT Short Term Goals - 04/22/15 1802    OT SHORT TERM GOAL #1   Title Pt able to make full flexion and extention to hold sleeve and put hand in pocket    Baseline Flexion at MC's 80 to 85 , PIP's -10 to -30    Time 3   Period Weeks   Status New   OT SHORT TERM GOAL #2   Title Wrist AROM in flexion , ext , RD and UD improve with 10 degrees to improve use of hand in dressing    Baseline See  flowsheet    Time 3   Period Weeks   Status New   OT SHORT TERM GOAL #3   Title Pain on PREE improve with 10 points    Baseline at eval 39/50    Time 4   Period Weeks   Status New           OT Long Term Goals - 04/22/15 1806    OT LONG TERM GOAL #1   Title shoulder  AROM for flexion and extention improve to WNL  to reach above head    Baseline shoulder flexion and ABD 120    Time 4   Period Weeks   Status New   OT LONG TERM GOAL #2   Title Grip strenght to be assess   Time 4   Period Weeks   Status New   OT LONG TERM GOAL #3   Title Elbow AROM in flexion and extention improve with at least 10 degrees for pt to  feed self and do buttons    Baseline  flexion 90 and extnetion -32   Time 6   Period Weeks   Status New               Plan - 04/22/15 1753    Clinical Impression Statement Pt present 9 wks postop ORIF of L elbow fracture - show decrease elbow flexion more than extnetion , decrease shoulder AROM flexion and ABD about 120 degrees, decrease wrist AROM in all planes except supination- edema  and stiffness in  hand -  tighteness in 3rd flexor tendon with tenderness at A1 pulley -  Pt report pain increase to about 8/10 in hand , forearm to elbow - and  at rest always about  3/10    Pt will benefit from skilled therapeutic intervention in order to improve on the following deficits (Retired) Decreased coordination;Decreased range of motion;Impaired flexibility;Pain;Impaired UE functional use;Decreased scar mobility;Decreased strength;Decreased knowledge of use of DME   Rehab Potential Good   OT Frequency 2x / week   OT Duration 6 weeks   OT Treatment/Interventions Self-care/ADL training;Moist Heat;Fluidtherapy;Parrafin;Therapeutic exercise;Patient/family education;Manual Therapy;DME and/or AE instruction;Passive range of motion;Scar mobilization   Plan Assess HEP and upgrade - soft  tissue mobs with Graston   OT Home Exercise Plan see pt instruction    Consulted and  Agree with Plan of Care Patient        Problem List Patient Active Problem List   Diagnosis Date Noted  . Olecranon fracture 02/18/2015    Oletta Cohn OTR/L,CLT  04/22/2015, 6:55 PM  [place] [facility] PHYSICAL AND SPORTS MEDICINE [address] [place], [address] Phone: [phone]   Fax:  [phone]  Name: Rebecca Page MRN: [id number] Date of Birth: 12-24-1945

## 2015-04-29 ENCOUNTER — Ambulatory Visit: Payer: Medicare Other | Admitting: Occupational Therapy

## 2015-04-29 DIAGNOSIS — M25642 Stiffness of left hand, not elsewhere classified: Secondary | ICD-10-CM

## 2015-04-29 DIAGNOSIS — M25622 Stiffness of left elbow, not elsewhere classified: Secondary | ICD-10-CM

## 2015-04-29 DIAGNOSIS — M6281 Muscle weakness (generalized): Secondary | ICD-10-CM

## 2015-04-29 DIAGNOSIS — M79632 Pain in left forearm: Secondary | ICD-10-CM

## 2015-04-29 NOTE — Therapy (Signed)
[place] [facility] PHYS[facility]ORTS MEDICINE [address] [place], [address] Phone: [phone]   Fax:  [phone]  Occupational Therapy Treatment  Patient Details  Name: Rebecca Page MRN: [id number] Date of Birth: [birth year] Referring Provider: Dr Rosita Kea  Encounter Date: 04/29/2015      OT End of Session - 04/29/15 0947    Visit Number 2   Number of Visits 12   Date for OT Re-Evaluation 06/03/15   OT Start Time 0846   OT Stop Time 0931   OT Time Calculation (min) 45 min   Activity Tolerance Patient tolerated treatment well   Behavior During Therapy [facility] for tasks assessed/performed      Past Medical History  Diagnosis Date  . Osteoporosis     Past Surgical History  Procedure Laterality Date  . Laparoscopic oopherectomy    . Orif elbow fracture Left 02/18/2015    Procedure: OPEN REDUCTION INTERNAL FIXATION (ORIF) ELBOW/OLECRANON FRACTURE;  Surgeon: Kennedy Bucker, MD;  Location: [facility] OR[facility];  Service: Orthopedics;  Laterality: Left;    There were no vitals filed for this visit.  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 Yes   Pain Score 2    Pain Location Elbow   Pain Orientation Left   Pain Descriptors / Indicators Aching;Burning   Pain Onset More than a month ago            [facility] OT Assessment - 04/29/15 0001    AROM   Left Elbow Flexion 108   Left Elbow Extension -28    Left Forearm Pronation 80 Degrees   Left Wrist Extension 34 Degrees   Left Wrist Flexion 74 Degrees                  OT Treatments/Exercises (OP) - 04/29/15 0001    Moist Heat Therapy   Number Minutes Moist Heat 10 Minutes   Moist Heat Location Elbow   LUE Fluidotherapy   Number Minutes Fluidotherapy 10 Minutes   LUE Fluidotherapy Location Hand;Wrist   Comments AROM for digits and wrist in all planes at [facility] to increaese ROM and decrease pain    Manual Therapy   Manual therapy comments Soft tissue mobs to palm with edge tool to decrease stiffness and increase ROM - and Graston tool 2 and 4 on distal bicep and tricep prior to ROM at elbow  -forearm tight at elbow - flexors of forearm -      Measurements taken for elbow and wrist   see flowsheet  AAROM and AROM for elbow extention in sitting and standing against wall - with upper arm against wall - pt to provided padding proximal to elbow not at elbow   10 reps x 2 each  AAROM for pronation , AROM supination  PROM for  wrist extention  AROM for fisting               OT Education - 04/29/15 0947    Education provided Yes   Education Details HEP   Person(s) Educated Patient   Methods Explanation;Demonstration;Tactile cues;Verbal cues   Comprehension Verbal cues required;Returned demonstration;Verbalized understanding          OT Short Term Goals - 04/22/15 1802    OT SHORT TERM GOAL #1   Title Pt able to make full flexion and extention to hold sleeve and put hand in pocket    Baseline Flexion at MC's 80 to 85 , PIP's -10 to -30    Time 3   Period Weeks   Status New   OT SHORT TERM GOAL #2   Title Wrist AROM in flexion , ext , RD and UD improve with 10 degrees to improve use of hand in dressing    Baseline See flowsheet    Time 3   Period Weeks   Status New   OT SHORT TERM GOAL #3   Title Pain on PREE improve with 10 points    Baseline at eval 39/50    Time 4   Period Weeks   Status New            OT Long Term Goals - 04/22/15 1806    OT LONG TERM GOAL #1   Title shoulder  AROM for flexion and extention improve to WNL  to reach above head    Baseline shoulder flexion and ABD 120    Time 4   Period Weeks   Status New   OT LONG TERM GOAL #2   Title Grip strenght to be assess   Time 4   Period Weeks   Status New   OT LONG TERM GOAL #3   Title Elbow AROM in flexion and extention improve with at least 10 degrees for pt to  feed self and do buttons    Baseline  flexion 90 and extnetion -32   Time 6   Period Weeks   Status New               Plan - 04/29/15 0947    Clinical Impression Statement Pt showed some good progress in wrist flexion and wrist flexion - as well as extention at elbow - but discomfort at hard ware at elbow - pt to do padding obove elbow instead than on it - pt to cont With HEP -   Pt will benefit from skilled therapeutic intervention in order to improve on the following deficits (Retired) Decreased coordination;Decreased range of motion;Impaired flexibility;Pain;Impaired UE functional use;Decreased scar mobility;Decreased strength;Decreased knowledge of use of DME   Rehab Potential Good   OT Frequency 2x / week   OT Duration 6 weeks   OT Treatment/Interventions Self-care/ADL training;Moist Heat;Fluidtherapy;Parrafin;Therapeutic exercise;Patient/family education;Manual Therapy;DME and/or AE instruction;Passive range of motion;Scar mobilization   Plan upgrade as needed , and assess ROM    OT Home Exercise Plan see pt instruction    Consulted and Agree with Plan of Care Patient        Problem List Patient Active Problem List   Diagnosis Date Noted  . Olecranon fracture 02/18/2015    Oletta CohnuPreez, Rowyn Mustapha OTR/L, ,CLT  04/29/2015, 9:50 AM  [place] [facility]TS MEDICINE [address] [place], [place], [address] Phone: [phone]   Fax:  [phone]  Name: Rebecca Page MRN: [id number] Date of  Birth: 12/23/1945

## 2015-04-29 NOTE — Patient Instructions (Signed)
Same as last time - can do supine and work on flexion to ear, shoulder  Padding proximal to elbow when doing HEP on wall

## 2015-05-01 ENCOUNTER — Ambulatory Visit: Payer: Medicare Other | Admitting: Occupational Therapy

## 2015-05-01 DIAGNOSIS — M25642 Stiffness of left hand, not elsewhere classified: Secondary | ICD-10-CM | POA: Diagnosis not present

## 2015-05-01 DIAGNOSIS — M6281 Muscle weakness (generalized): Secondary | ICD-10-CM

## 2015-05-01 DIAGNOSIS — M79632 Pain in left forearm: Secondary | ICD-10-CM

## 2015-05-01 DIAGNOSIS — M25622 Stiffness of left elbow, not elsewhere classified: Secondary | ICD-10-CM

## 2015-05-01 NOTE — Patient Instructions (Signed)
   Pt to do contrast at home - do [facility] for elbow flexion and extention in supine - and avoid tight , or sustained grip , wrist flexion

## 2015-05-01 NOTE — Therapy (Signed)
[place] [facility] PHYSICAL AND SPORTS MEDICINE [address] [place], [address] Phone: [phone]   Fax:  [phone]  Occupational Therapy Treatment  Patient Details  Name: Rebecca Page MRN: [id number] Date of Birth: Feb 17, 1945 Referring Provider: Dr Rosita Kea  Encounter Date: 05/01/2015      OT End of Session - 05/01/15 1337    Visit Number 3   Number of Visits 12   Date for OT Re-Evaluation 06/03/15   OT Start Time 1115   OT Stop Time 1205   OT Time Calculation (min) 50 min   Activity Tolerance Patient tolerated treatment well   Behavior During Therapy [facility] for tasks assessed/performed      Past Medical History  Diagnosis Date  . Osteoporosis     Past Surgical History  Procedure Laterality Date  . Laparoscopic oopherectomy    . Orif elbow fracture Left 02/18/2015    Procedure: OPEN REDUCTION INTERNAL FIXATION (ORIF) ELBOW/OLECRANON FRACTURE;  Surgeon: Kennedy Bucker, MD;  Location: [facility] OR[facility];  Service: Orthopedics;  Laterality: Left;    There were no vitals filed for this visit.  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 Yes   Pain Score 2    Pain Location Elbow   Pain Orientation Left   Pain Descriptors / Indicators Aching   Pain Onset More than a month ago                      OT Treatments/Exercises (OP) - 05/01/15 0001    Moist Heat Therapy   Number Minutes Moist Heat 10 Minutes   Moist Heat Location Elbow    LUE Contrast Bath   Time 11 minutes   Comments to decrease CT symptoms in hand and wrist       Pt  Tested for CTS - neg for Tinel but positive with compression of CT - modified Phalens  Soft tissue mobs to CT and flexors of forearm , as well as distal bicep prior to ROM - Graston tool 2 and 4 - for sweeping , scooping and brushing   Prayer stretch for wrist extention , place and hold wrist extention with loose fist   Elbow AAROM in supine for 3 positions  AROM  In 3 positions  12 reps each  AAROM flexion with elbow at 90 and AROM to ear  10 reps  AROM extention to flexion - touching mat and ear 12 reps            OT Education - 05/01/15 1337    Education provided Yes   Education Details HEP    Person(s) Educated Patient   Methods Explanation;Demonstration;Tactile cues;Verbal cues   Comprehension Verbal cues required;Returned demonstration;Verbalized understanding          OT Short Term Goals - 04/22/15 1802    OT SHORT TERM GOAL #1  Title Pt able to make full flexion and extention to hold sleeve and put hand in pocket    Baseline Flexion at MC's 80 to 85 , PIP's -10 to -30    Time 3   Period Weeks   Status New   OT SHORT TERM GOAL #2   Title Wrist AROM in flexion , ext , RD and UD improve with 10 degrees to improve use of hand in dressing    Baseline See flowsheet    Time 3   Period Weeks   Status New   OT SHORT TERM GOAL #3   Title Pain on PREE improve with 10 points    Baseline at eval 39/50    Time 4   Period Weeks   Status New           OT Long Term Goals - 04/22/15 1806    OT LONG TERM GOAL #1   Title shoulder  AROM for flexion and extention improve to WNL  to reach above head    Baseline shoulder flexion and ABD 120    Time 4   Period Weeks   Status New   OT LONG TERM GOAL #2   Title Grip strenght to be assess   Time 4   Period Weeks   Status New   OT LONG TERM GOAL #3   Title Elbow AROM in flexion and extention improve with at least  10 degrees for pt to  feed self and do buttons    Baseline  flexion 90 and extnetion -32   Time 6   Period Weeks   Status New               Plan - 05/01/15 1338    Clinical Impression Statement Pt showing some good ROM progress in L elbow - do show some positive test for Phalens on L - and report some numbness in L hand at times - aincrease swelling at night time and stiffness - pt to start some contrast , and hold off on flexion stretch for wrsit and keep wrist straight during elbow exercises - wrist want to flex    Pt will benefit from skilled therapeutic intervention in order to improve on the following deficits (Retired) Decreased coordination;Decreased range of motion;Impaired flexibility;Pain;Impaired UE functional use;Decreased scar mobility;Decreased strength;Decreased knowledge of use of DME   Rehab Potential Good   OT Frequency 2x / week   OT Duration 6 weeks   OT Treatment/Interventions Self-care/ADL training;Moist Heat;Fluidtherapy;Parrafin;Therapeutic exercise;Patient/family education;Manual Therapy;DME and/or AE instruction;Passive range of motion;Scar mobilization   Plan upgrade HEP as needed - monitor CTS   OT Home Exercise Plan see pt instruction    Consulted and Agree with Plan of Care Patient        Problem List Patient Active Problem List   Diagnosis Date Noted  . Olecranon fracture 02/18/2015    Rebecca Page, Rebecca Page OTR/L,CLT  05/01/2015, 1:47 PM  [facility]IC[facility] [address] [place], [place], [address] Phone: [phone]   Fax:  [phone]  Name: Rebecca Page MRN: [id number] Date of Birth: 05/31/1945

## 2015-05-04 ENCOUNTER — Ambulatory Visit: Payer: Medicare Other | Admitting: Occupational Therapy

## 2015-05-04 DIAGNOSIS — M6281 Muscle weakness (generalized): Secondary | ICD-10-CM

## 2015-05-04 DIAGNOSIS — M25642 Stiffness of left hand, not elsewhere classified: Secondary | ICD-10-CM

## 2015-05-04 DIAGNOSIS — M79632 Pain in left forearm: Secondary | ICD-10-CM

## 2015-05-04 DIAGNOSIS — M25622 Stiffness of left elbow, not elsewhere classified: Secondary | ICD-10-CM

## 2015-05-04 NOTE — Therapy (Signed)
[place] [facility] PHYSICAL AND SPORTS MEDICINE [address], [place], [address] Phone: [phone]   Fax:  [phone]  Occupational Therapy Treatment  Patient Details  Name: Rebecca Page MRN: [id number] Date of Birth: [birth year] Referring Provider: Dr Rosita Kea  Encounter Date: 05/04/2015      OT End of Session - 05/04/15 1154    Visit Number 4   Number of Visits 12   Date for OT Re-Evaluation 06/03/15   OT Start Time 1131   OT Stop Time 1218   OT Time Calculation (min) 47 min   Activity Tolerance Patient tolerated treatment well   Behavior During Therapy [facility] for tasks assessed/performed      Past Medical History  Diagnosis Date  . Osteoporosis     Past Surgical History  Procedure Laterality Date  . Laparoscopic oopherectomy    . Orif elbow fracture Left 02/18/2015    Procedure: OPEN REDUCTION INTERNAL FIXATION (ORIF) ELBOW/OLECRANON FRACTURE;  Surgeon: Kennedy Bucker, MD;  Location: [facility] ORS;  Service: Orthopedics;  Laterality: Left;    There were no vitals filed for this visit.  Visit Diagnosis:  Stiffness of hand joint, left  Muscle weakness  Stiffness of elbow joint, left  Pain of left forearm      Subjective Assessment - 05/04/15 1134    Subjective  Look my range of motion is so much better - pain some what in upper arm - I could not do my exerices as usually - had busy Sat with grand kids   Patient Stated Goals I want to use my L arm again and hand - like sewing, knitting, volunteer at animal rescue , play with grand kids , gardening    Currently in Pain? Yes   Pain Score 2    Pain Location Arm   Pain Orientation Left   Pain Descriptors / Indicators Aching   Pain Onset More than a month ago            [facility] OT Assessment - 05/04/15 0001    AROM   Left Elbow Flexion 120   Left Elbow Extension -25   Left Forearm Pronation 85 Degrees   Left Wrist Extension 54 Degrees   Left Wrist Flexion 68 Degrees                   OT Treatments/Exercises (OP) - 05/04/15 0001    Moist Heat Therapy   Number Minutes Moist Heat 10 Minutes   Moist Heat Location Elbow   LUE Fluidotherapy   Number Minutes Fluidotherapy 10 Minutes   LUE Fluidotherapy Location Hand;Wrist   Comments Att SOC to increase wrist ROM  and decrease stiffness in hand    Splinting   Splinting Fitted with wrist splint for CTS to wear at night time -  L xsmall    Manual Therapy   Manual therapy comments cica scar pad for night time use       Soft tissue mobs to CT and flexors of forearm , as well as distal bicep/tricep prior to ROM - Graston tool 2 and 4 - for sweeping , scooping and brushing   Elbow AAROM in supine for 3 positions extention  AAROM In 3 positions  12 reps each  AROM with 1 lbs weight for elbow flexion supine with shoulder at 90 degrees-10 reps  In combination with elbow extention 10 reps Standing elbow extention 1 lbs  Ext rotation 10 reps 1 lbs weight  OT Education - 05/04/15 1154    Education provided Yes   Education Details HEP   Person(s) Educated Patient   Methods Explanation;Demonstration;Tactile cues;Verbal cues   Comprehension Verbal cues required;Returned demonstration;Verbalized understanding          OT Short Term Goals - 04/22/15 1802    OT SHORT TERM GOAL #1   Title Pt able to make full flexion and extention to hold sleeve and put hand in pocket    Baseline Flexion at MC's 80 to 85 , PIP's -10 to -30    Time 3   Period Weeks   Status New   OT SHORT TERM GOAL #2   Title Wrist AROM in flexion , ext , RD and UD improve with 10 degrees to improve use of hand in dressing    Baseline See flowsheet    Time 3   Period Weeks   Status New   OT SHORT TERM GOAL #3   Title Pain on PREE improve with 10 points    Baseline at eval 39/50    Time 4   Period Weeks   Status New           OT Long Term Goals - 04/22/15 1806    OT LONG TERM GOAL #1   Title  shoulder  AROM for flexion and extention improve to WNL  to reach above head    Baseline shoulder flexion and ABD 120    Time 4   Period Weeks   Status New   OT LONG TERM GOAL #2   Title Grip strenght to be assess   Time 4   Period Weeks   Status New   OT LONG TERM GOAL #3   Title Elbow AROM in flexion and extention improve with at least 10 degrees for pt to  feed self and do buttons    Baseline  flexion 90 and extnetion -32   Time 6   Period Weeks   Status New               Plan - 05/04/15 1605    Clinical Impression Statement Pt made great progress in exteniton and flexion of elbow sinc 2 wks ago - did iniitated this date 1 lbs weight - wrist extention did improve - but CTS still present j- fitted with wrist splint to wear at night time    Pt will benefit from skilled therapeutic intervention in order to improve on the following deficits (Retired) Decreased coordination;Decreased range of motion;Impaired flexibility;Pain;Impaired UE functional use;Decreased scar mobility;Decreased strength;Decreased knowledge of use of DME   Rehab Potential Good   OT Frequency 2x / week   OT Duration 6 weeks   Plan upgrade as needed    OT Home Exercise Plan see pt instruction    Consulted and Agree with Plan of Care Patient        Problem List Patient Active Problem List   Diagnosis Date Noted  . Olecranon fracture 02/18/2015    Oletta CohnuPreez, Jesslynn Kruck OTR/L,CLT  05/04/2015, 4:08 PM  [place] [facility] [address] [place], [place], [address] Phone: [phone]   Fax:  [phone]  Name: Greta DoomGloria Socarras MRN: [id number] Date of Birth: 12/01/1945

## 2015-05-04 NOTE — Patient Instructions (Addendum)
Same HEP but add AROM with 1 lbs weight for elbow flexion supine with shoulder at 90 degrees-10 reps  In combination with elbow extention 10 reps Standing elbow extention 1 lbs  Ext rotation 10 reps 1 lbs weight

## 2015-05-08 ENCOUNTER — Ambulatory Visit: Payer: Medicare Other | Admitting: Occupational Therapy

## 2015-05-08 DIAGNOSIS — M25642 Stiffness of left hand, not elsewhere classified: Secondary | ICD-10-CM | POA: Diagnosis not present

## 2015-05-08 DIAGNOSIS — M79632 Pain in left forearm: Secondary | ICD-10-CM

## 2015-05-08 DIAGNOSIS — M6281 Muscle weakness (generalized): Secondary | ICD-10-CM

## 2015-05-08 DIAGNOSIS — M25622 Stiffness of left elbow, not elsewhere classified: Secondary | ICD-10-CM

## 2015-05-08 NOTE — Patient Instructions (Addendum)
   Pt hold off on 1 lbs - and can try back one exercise at time - if now issues then add next one  Built up handle provided to put around 1 lbs weight to avoid tight grip

## 2015-05-08 NOTE — Therapy (Signed)
[facility] [address], [place], [address] Phone: [phone]   Fax:  [phone]  Occupational Therapy Treatment  Patient Details  Name: Rebecca Page MRN: [id number] Date of Birth: 04-17-1945 Referring Provider: Dr Rosita Kea  Encounter Date: 05/08/2015      OT End of Session - 05/08/15 1352    Visit Number 5   Number of Visits 12   Date for OT Re-Evaluation 06/03/15   OT Start Time 1045   OT Stop Time 1128   OT Time Calculation (min) 43 min   Activity Tolerance Patient tolerated treatment well   Behavior During Therapy [facility] for tasks assessed/performed      Past Medical History  Diagnosis Date  . Osteoporosis     Past Surgical History  Procedure Laterality Date  . Laparoscopic oopherectomy    . Orif elbow fracture Left 02/18/2015    Procedure: OPEN REDUCTION INTERNAL FIXATION (ORIF) ELBOW/OLECRANON FRACTURE;  Surgeon: Kennedy Bucker, MD;  Location: [facility] ORS;  Service: Orthopedics;  Laterality: Left;    There were no vitals filed for this visit.  Visit Diagnosis:  Stiffness of hand joint, left  Muscle weakness  Stiffness of elbow joint, left  Pain of left forearm      Subjective Assessment - 05/08/15 1347    Subjective  2 days ago had more pins and needles in my hand , swelling and some discomfort at my elbow - I hold off on 1 lbs weight yesterday - better today - wanted to hear what you think    Patient Stated Goals I want to use my L arm again and hand - like sewing, knitting, volunteer at animal rescue , play with grand kids , gardening    Currently in Pain? Yes   Pain Score 2    Pain Location Elbow   Pain Orientation Left   Pain Descriptors / Indicators Aching                      OT Treatments/Exercises (OP) - 05/08/15 0001    Moist Heat Therapy   Number Minutes Moist Heat 10 Minutes   Moist Heat Location Elbow      Graston tool 2 and 4 for sweeping and scooping over  volar wrist and forearm to decrease tightness and decrease CTS   Pt Positive Phalen's  Pt during session gain extention AROM to earlier this week  - AROM supine and standing - to wall and mat  Flexion was decrease and pt had some clicking at end range - about 115 degrees - with increase tightness feeling   Supination AROM WNL   Pt hold off on 1 lbs - and can try back one exercise at time - if now issues then add next one  Built up handle provided to put around 1 lbs weight to avoid tight grip           OT Education - 05/08/15 1352    Education provided Yes   Education Details HEP   Person(s) Educated Patient   Methods Explanation;Demonstration;Tactile cues;Verbal cues   Comprehension Verbal cues required;Returned demonstration;Verbalized understanding          OT Short Term Goals - 04/22/15 1802    OT SHORT TERM GOAL #1   Title Pt able to make full flexion and extention to hold sleeve and put hand in pocket    Baseline Flexion at MC's 80 to 85 , PIP's -10 to -30  Time 3   Period Weeks   Status New   OT SHORT TERM GOAL #2   Title Wrist AROM in flexion , ext , RD and UD improve with 10 degrees to improve use of hand in dressing    Baseline See flowsheet    Time 3   Period Weeks   Status New   OT SHORT TERM GOAL #3   Title Pain on PREE improve with 10 points    Baseline at eval 39/50    Time 4   Period Weeks   Status New           OT Long Term Goals - 04/22/15 1806    OT LONG TERM GOAL #1   Title shoulder  AROM for flexion and extention improve to WNL  to reach above head    Baseline shoulder flexion and ABD 120    Time 4   Period Weeks   Status New   OT LONG TERM GOAL #2   Title Grip strenght to be assess   Time 4   Period Weeks   Status New   OT LONG TERM GOAL #3   Title Elbow AROM in flexion and extention improve with at least 10 degrees for pt to  feed self and do buttons    Baseline  flexion 90 and extnetion -32   Time 6   Period Weeks    Status New               Plan - 05/08/15 1352    Clinical Impression Statement Pt has appt with Dr Rosita KeaMenz next week  - pt to follow up - pt appear to have CTS - ? nerve conduction test - pt made until Monday great progress in ROM at elbow - pt CTS , and come clicking at end range flexion limiting her progress    Pt will benefit from skilled therapeutic intervention in order to improve on the following deficits (Retired) Decreased coordination;Decreased range of motion;Impaired flexibility;Pain;Impaired UE functional use;Decreased scar mobility;Decreased strength;Decreased knowledge of use of DME   Rehab Potential Good   OT Frequency 2x / week   OT Duration 4 weeks   OT Treatment/Interventions Self-care/ADL training;Moist Heat;Fluidtherapy;Parrafin;Therapeutic exercise;Patient/family education;Manual Therapy;DME and/or AE instruction;Passive range of motion;Scar mobilization   Plan await results from MD visit   OT Home Exercise Plan see pt instruction    Consulted and Agree with Plan of Care Patient        Problem List Patient Active Problem List   Diagnosis Date Noted  . Olecranon fracture 02/18/2015    Oletta CohnuPreez, Porchea Charrier OTR/L,CLT  05/08/2015, 1:55 PM  [facility] [address] [place], [place], [address] Phone: [phone]   Fax:  [phone]  Name: Greta DoomGloria Harm MRN: [id number] Date of Birth: 10/04/1945

## 2015-05-15 ENCOUNTER — Ambulatory Visit: Payer: Medicare Other | Attending: Orthopedic Surgery | Admitting: Occupational Therapy

## 2015-05-15 DIAGNOSIS — M79632 Pain in left forearm: Secondary | ICD-10-CM

## 2015-05-15 DIAGNOSIS — M25642 Stiffness of left hand, not elsewhere classified: Secondary | ICD-10-CM | POA: Diagnosis present

## 2015-05-15 DIAGNOSIS — M6281 Muscle weakness (generalized): Secondary | ICD-10-CM | POA: Insufficient documentation

## 2015-05-15 DIAGNOSIS — M25622 Stiffness of left elbow, not elsewhere classified: Secondary | ICD-10-CM | POA: Diagnosis present

## 2015-05-15 NOTE — Therapy (Signed)
[facility] [address], [place], [address] Phone: [phone]   Fax:  [phone]  Occupational Therapy Treatment  Patient Details  Name: Rebecca Page MRN: [id number] Date of Birth: [birth year] Referring Provider: Dr Rosita Kea  Encounter Date: 05/15/2015      OT End of Session - 05/15/15 1331    Visit Number 6   Number of Visits 12   Date for OT Re-Evaluation 06/03/15   OT Start Time 1230   OT Stop Time 1306   OT Time Calculation (min) 36 min   Activity Tolerance Patient tolerated treatment well   Behavior During Therapy [facility] for tasks assessed/performed      Past Medical History  Diagnosis Date  . Osteoporosis     Past Surgical History  Procedure Laterality Date  . Laparoscopic oopherectomy    . Orif elbow fracture Left 02/18/2015    Procedure: OPEN REDUCTION INTERNAL FIXATION (ORIF) ELBOW/OLECRANON FRACTURE;  Surgeon: Kennedy Bucker, MD;  Location: [facility] ORS;  Service: Orthopedics;  Laterality: Left;    There were no vitals filed for this visit.      Subjective Assessment - 05/15/15 1327    Subjective  I seen DR Rosita Kea - going to have nerve conduction test on 24th and then suppose to have appt with Dr Rosita Kea - if CT will have release while doing hardware removal -    Patient Stated Goals I want to use my L arm again and hand - like sewing, knitting, volunteer at animal rescue , play with grand kids , gardening    Currently in Pain? Yes   Pain Score 2    Pain Location Elbow   Pain Orientation Left   Pain Descriptors / Indicators Aching   Pain Onset More than a month ago            [facility] OT Assessment - 05/15/15 0001    AROM   Left Elbow Flexion 120   Left Elbow Extension -30                  OT Treatments/Exercises (OP) - 05/15/15 0001    Moist Heat Therapy   Number Minutes Moist Heat 10 Minutes   Moist Heat Location Elbow      Elbow AAROM in supine for 3 positions extention -  10 reps  Measured at [facility]  - then heat 10 min  Wrist Extention gentle PROM and flexion  AROM RD and UD  Supination AROM   12 reps each  CT symptoms present           OT Education - 05/15/15 1330    Education provided Yes   Education Details HEP   Person(s) Educated Patient   Methods Explanation;Demonstration;Tactile cues;Verbal cues   Comprehension Returned demonstration;Verbalized understanding          OT Short Term Goals - 05/15/15 1333    OT SHORT TERM GOAL #1   Title Pt able to make full flexion and extention to hold sleeve and put hand in pocket    Status Achieved   OT SHORT TERM GOAL #2   Title Wrist AROM in flexion , ext , RD and UD improve with 10 degrees to improve use of hand in dressing    Baseline extention impaired and flexion - CTS present    Time 3   Period Weeks   Status On-going   OT SHORT TERM GOAL #3   Title Pain on PREE improve with 10 points  Baseline pain at evlw 39/50    Time 4   Period Weeks   Status On-going           OT Long Term Goals - 05/15/15 1334    OT LONG TERM GOAL #1   Title shoulder  AROM for flexion and extention improve to WNL  to reach above head    Baseline NT - at eval was 120    Time 4   Period Weeks   Status On-going   OT LONG TERM GOAL #2   Title Grip strenght to be assess   Baseline NT - had CTS - and not strenghtening for elbow    Time 4   Period Weeks   Status On-going   OT LONG TERM GOAL #3   Title Elbow AROM in flexion and extention improve with at least 10 degrees for pt to  feed self and do buttons    Baseline flexion improve to 120 from 90; extnetion -32 to -28   Time 6   Period Weeks   Status On-going               Plan - 05/15/15 1331    Clinical Impression Statement Pt has nerve conduction test scheduled for 24th - and then depends on results wiill have elbow hardware remove with release - pt to phone me if schedule for surgery and let me know results of nerve conduction    Rehab  Potential Good   OT Frequency Other (comment)   OT Duration 4 weeks   OT Treatment/Interventions Self-care/ADL training;Moist Heat;Fluidtherapy;Parrafin;Therapeutic exercise;Patient/family education;Manual Therapy;DME and/or AE instruction;Passive range of motion;Scar mobilization   Plan Hold off until surgery - pt to phone with results after 24th nerve conduction   OT Home Exercise Plan see pt instruction    Consulted and Agree with Plan of Care Patient      Patient will benefit from skilled therapeutic intervention in order to improve the following deficits and impairments:  Decreased coordination, Decreased range of motion, Impaired flexibility, Pain, Impaired UE functional use, Decreased scar mobility, Decreased strength, Decreased knowledge of use of DME  Visit Diagnosis: Stiffness of hand joint, left  Muscle weakness  Stiffness of elbow joint, left  Pain of left forearm      G-Codes - 05/14/15 1911    Functional Assessment Tool Used ROM , PREE, pain , clnical judgement    Functional Limitation Self care   Self Care Current Status (W2956(G8987) At least 60 percent but less than 80 percent impaired, limited or restricted   Self Care Goal Status (O1308(G8988) At least 1 percent but less than 20 percent impaired, limited or restricted      Problem List Patient Active Problem List   Diagnosis Date Noted  . Olecranon fracture 02/18/2015    Oletta CohnuPreez, Shateka Petrea OTR/L,CLT  05/15/2015, 1:36 PM  [facility] [address] [place], [place], [address] Phone: [phone]   Fax:  [phone]  Name: Greta DoomGloria Laughery MRN: [id number] Date of Birth: 08/15/1945

## 2015-05-15 NOTE — Patient Instructions (Signed)
Same as the last week- to maintain ROM at elbow and if can do 1 lbs - if increase symptoms hold off

## 2015-06-22 ENCOUNTER — Encounter
Admission: RE | Admit: 2015-06-22 | Discharge: 2015-06-22 | Disposition: A | Discharge status: Home / Self Care | Payer: Medicare Other | Source: Ambulatory Visit | Attending: Orthopedic Surgery | Admitting: Orthopedic Surgery

## 2015-06-22 DIAGNOSIS — Y929 Unspecified place or not applicable: Secondary | ICD-10-CM | POA: Diagnosis not present

## 2015-06-22 DIAGNOSIS — Z79899 Other long term (current) drug therapy: Secondary | ICD-10-CM | POA: Diagnosis not present

## 2015-06-22 DIAGNOSIS — Y838 Other surgical procedures as the cause of abnormal reaction of the patient, or of later complication, without mention of misadventure at the time of the procedure: Secondary | ICD-10-CM | POA: Diagnosis not present

## 2015-06-22 DIAGNOSIS — Z7982 Long term (current) use of aspirin: Secondary | ICD-10-CM | POA: Diagnosis not present

## 2015-06-22 DIAGNOSIS — M1990 Unspecified osteoarthritis, unspecified site: Secondary | ICD-10-CM | POA: Diagnosis not present

## 2015-06-22 DIAGNOSIS — I4891 Unspecified atrial fibrillation: Secondary | ICD-10-CM

## 2015-06-22 DIAGNOSIS — T8484XA Pain due to internal orthopedic prosthetic devices, implants and grafts, initial encounter: Secondary | ICD-10-CM | POA: Diagnosis not present

## 2015-06-22 DIAGNOSIS — G5602 Carpal tunnel syndrome, left upper limb: Secondary | ICD-10-CM | POA: Diagnosis present

## 2015-06-22 HISTORY — DX: Unspecified atrial fibrillation: I48.91

## 2015-06-22 HISTORY — DX: Unspecified osteoarthritis, unspecified site: M19.90

## 2015-06-22 HISTORY — DX: Cardiac arrhythmia, unspecified: I49.9

## 2015-06-22 HISTORY — DX: Anemia, unspecified: D64.9

## 2015-06-22 LAB — CBC
HEMATOCRIT: 44 % (ref 35.0–47.0)
HEMOGLOBIN: 14.8 g/dL (ref 12.0–16.0)
MCH: 30.5 pg (ref 26.0–34.0)
MCHC: 33.6 g/dL (ref 32.0–36.0)
MCV: 90.7 fL (ref 80.0–100.0)
Platelets: 145 10*3/uL — ABNORMAL LOW (ref 150–440)
RBC: 4.85 MIL/uL (ref 3.80–5.20)
RDW: 13.6 % (ref 11.5–14.5)
WBC: 5.8 10*3/uL (ref 3.6–11.0)

## 2015-06-22 LAB — DIFFERENTIAL
BASOS ABS: 0 10*3/uL (ref 0–0.1)
BASOS PCT: 0 %
Eosinophils Absolute: 0 10*3/uL (ref 0–0.7)
Eosinophils Relative: 1 %
LYMPHS PCT: 35 %
Lymphs Abs: 2 10*3/uL (ref 1.0–3.6)
MONOS PCT: 7 %
Monocytes Absolute: 0.4 10*3/uL (ref 0.2–0.9)
NEUTROS ABS: 3.3 10*3/uL (ref 1.4–6.5)
Neutrophils Relative %: 57 %

## 2015-06-22 NOTE — Patient Instructions (Signed)
  Your procedure is scheduled on: Jun 25, 2015 (Thursday) Report to Day Surgery.([facility]) SECOND FLOOR To find out your arrival time please call [phone] between 1PM - 3PM on Jun 24, 2015 (Wednesday).  Remember: Instructions that are not followed completely may result in serious medical risk, up to and including death, or upon the discretion of your surgeon and anesthesiologist your surgery may need to be rescheduled.    __x__ 1. Do not eat food or drink liquids after midnight. No gum chewing or hard candies.     __x__ 2. No Alcohol for 24 hours before or after surgery.   ____ 3. Bring all medications with you on the day of surgery if instructed.    __x__ 4. Notify your doctor if there is any change in your medical condition     (cold, fever, infections).     Do not wear jewelry, make-up, hairpins, clips or nail polish.  Do not wear lotions, powders, or perfumes. You may wear deodorant.  Do not shave 48 hours prior to surgery. Men may shave face and neck.  Do not bring valuables to the hospital.    [facility] any belongings or valuables.               Contacts, dentures or bridgework may not be worn into surgery.  Leave your suitcase in the car. After surgery it may be brought to your room.  For patients admitted to the hospital, discharge time is determined by your                treatment team.   Patients discharged the day of surgery will not be allowed to drive home.   Please read over the following fact sheets that you were given:   Surgical Site Infection Prevention   __x__ Take these medicines the morning of surgery with A SIP OF WATER:    1. Flecainide  2.   3.   4.  5.  6.  ____ Fleet Enema (as directed)   __x__ Use CHG Soap as directed  ____ Use inhalers on the day of surgery  ____ Stop metformin 2 days prior to surgery    ____ Take 1/2 of usual insulin dose the night before surgery and none on the morning of surgery.    _x___ Stop Coumadin/Plavix/aspirin on (DO NOT TAKE ASPIRIN THE MORNING OF SURGERY)  _x__ Stop Anti-inflammatories on (NO NSAIDS) TYLENOL OK TO TAKE FOR PAIN IF NEEDED     ____ Stop supplements until after surgery.    ____ Bring C-Pap to the hospital.

## 2015-06-25 ENCOUNTER — Ambulatory Visit: Payer: Medicare Other | Admitting: Anesthesiology

## 2015-06-25 ENCOUNTER — Encounter
Admission: RE | Disposition: A | Discharge status: Home / Self Care | Payer: Self-pay | Source: Ambulatory Visit | Attending: Orthopedic Surgery

## 2015-06-25 ENCOUNTER — Ambulatory Visit
Admission: RE | Admit: 2015-06-25 | Discharge: 2015-06-25 | Disposition: A | Discharge status: Home / Self Care | Payer: Medicare Other | Source: Ambulatory Visit | Attending: Orthopedic Surgery | Admitting: Orthopedic Surgery

## 2015-06-25 DIAGNOSIS — Y838 Other surgical procedures as the cause of abnormal reaction of the patient, or of later complication, without mention of misadventure at the time of the procedure: Secondary | ICD-10-CM | POA: Insufficient documentation

## 2015-06-25 DIAGNOSIS — Y929 Unspecified place or not applicable: Secondary | ICD-10-CM | POA: Insufficient documentation

## 2015-06-25 DIAGNOSIS — G5602 Carpal tunnel syndrome, left upper limb: Secondary | ICD-10-CM | POA: Insufficient documentation

## 2015-06-25 DIAGNOSIS — Z7982 Long term (current) use of aspirin: Secondary | ICD-10-CM | POA: Insufficient documentation

## 2015-06-25 DIAGNOSIS — I4891 Unspecified atrial fibrillation: Secondary | ICD-10-CM | POA: Insufficient documentation

## 2015-06-25 DIAGNOSIS — T8484XA Pain due to internal orthopedic prosthetic devices, implants and grafts, initial encounter: Secondary | ICD-10-CM | POA: Insufficient documentation

## 2015-06-25 DIAGNOSIS — Z79899 Other long term (current) drug therapy: Secondary | ICD-10-CM | POA: Insufficient documentation

## 2015-06-25 DIAGNOSIS — M1990 Unspecified osteoarthritis, unspecified site: Secondary | ICD-10-CM | POA: Insufficient documentation

## 2015-06-25 HISTORY — PX: HARDWARE REMOVAL: SHX979

## 2015-06-25 HISTORY — PX: CARPAL TUNNEL RELEASE: SHX101

## 2015-06-25 SURGERY — CARPAL TUNNEL RELEASE
Anesthesia: General | Site: Wrist | Laterality: Left | Wound class: Clean

## 2015-06-25 MED ORDER — FENTANYL CITRATE (PF) 100 MCG/2ML IJ SOLN
INTRAMUSCULAR | Status: DC | PRN
  Administered 2015-06-25 (×2): 50 ug via INTRAVENOUS

## 2015-06-25 MED ORDER — FAMOTIDINE 20 MG PO TABS: 20.0000 mg | ORAL_TABLET | Freq: Once | ORAL | Status: DC

## 2015-06-25 MED ORDER — FAMOTIDINE 20 MG PO TABS
ORAL_TABLET | ORAL | Status: AC
  Administered 2015-06-25: 20 mg
  Filled 2015-06-25: qty 1

## 2015-06-25 MED ORDER — BUPIVACAINE HCL (PF) 0.5 % IJ SOLN
INTRAMUSCULAR | Status: AC
  Filled 2015-06-25: qty 30

## 2015-06-25 MED ORDER — ONDANSETRON HCL 4 MG/2ML IJ SOLN: 4.0000 mg | Freq: Four times a day (QID) | INTRAMUSCULAR | Status: DC | PRN

## 2015-06-25 MED ORDER — NEOMYCIN-POLYMYXIN B GU 40-200000 IR SOLN
Status: DC | PRN
  Administered 2015-06-25: 2 mL

## 2015-06-25 MED ORDER — LACTATED RINGERS IV SOLN
INTRAVENOUS | Status: DC
  Administered 2015-06-25: 11:00:00 via INTRAVENOUS

## 2015-06-25 MED ORDER — METOCLOPRAMIDE HCL 10 MG PO TABS: 5.0000 mg | ORAL_TABLET | Freq: Three times a day (TID) | ORAL | Status: DC | PRN

## 2015-06-25 MED ORDER — FENTANYL CITRATE (PF) 100 MCG/2ML IJ SOLN
25.0000 ug | INTRAMUSCULAR | Status: AC | PRN
  Administered 2015-06-25 (×6): 25 ug via INTRAVENOUS

## 2015-06-25 MED ORDER — ONDANSETRON HCL 4 MG PO TABS: 4.0000 mg | ORAL_TABLET | Freq: Four times a day (QID) | ORAL | Status: DC | PRN

## 2015-06-25 MED ORDER — SODIUM CHLORIDE 0.9 % IV SOLN: INTRAVENOUS | Status: DC

## 2015-06-25 MED ORDER — FENTANYL CITRATE (PF) 100 MCG/2ML IJ SOLN
INTRAMUSCULAR | Status: AC
  Administered 2015-06-25: 25 ug via INTRAVENOUS
  Filled 2015-06-25: qty 2

## 2015-06-25 MED ORDER — ONDANSETRON HCL 4 MG/2ML IJ SOLN
INTRAMUSCULAR | Status: DC | PRN
  Administered 2015-06-25: 4 mg via INTRAVENOUS

## 2015-06-25 MED ORDER — ONDANSETRON HCL 4 MG/2ML IJ SOLN
INTRAMUSCULAR | Status: AC
  Administered 2015-06-25: 4 mg via INTRAVENOUS
  Filled 2015-06-25: qty 2

## 2015-06-25 MED ORDER — LIDOCAINE HCL (CARDIAC) 20 MG/ML IV SOLN
INTRAVENOUS | Status: DC | PRN
  Administered 2015-06-25: 100 mg via INTRAVENOUS

## 2015-06-25 MED ORDER — ONDANSETRON HCL 4 MG/2ML IJ SOLN
4.0000 mg | Freq: Once | INTRAMUSCULAR | Status: AC | PRN
  Administered 2015-06-25: 4 mg via INTRAVENOUS

## 2015-06-25 MED ORDER — METOCLOPRAMIDE HCL 5 MG/ML IJ SOLN: 5.0000 mg | Freq: Three times a day (TID) | INTRAMUSCULAR | Status: DC | PRN

## 2015-06-25 MED ORDER — HYDROCODONE-ACETAMINOPHEN 5-325 MG PO TABS: 1.0000 | ORAL_TABLET | Freq: Four times a day (QID) | ORAL | Status: AC | PRN

## 2015-06-25 MED ORDER — PROPOFOL 10 MG/ML IV BOLUS
INTRAVENOUS | Status: DC | PRN
  Administered 2015-06-25: 40 mg via INTRAVENOUS
  Administered 2015-06-25: 140 mg via INTRAVENOUS

## 2015-06-25 MED ORDER — BUPIVACAINE HCL 0.5 % IJ SOLN
INTRAMUSCULAR | Status: DC | PRN
  Administered 2015-06-25: 10 mL

## 2015-06-25 MED ORDER — MIDAZOLAM HCL 2 MG/2ML IJ SOLN
INTRAMUSCULAR | Status: DC | PRN
  Administered 2015-06-25: 2 mg via INTRAVENOUS

## 2015-06-25 MED ORDER — HYDROCODONE-ACETAMINOPHEN 5-325 MG PO TABS: 1.0000 | ORAL_TABLET | ORAL | Status: DC | PRN

## 2015-06-25 SURGICAL SUPPLY — 52 items
BANDAGE ACE 3X5.8 VEL STRL LF (GAUZE/BANDAGES/DRESSINGS) ×3 IMPLANT
BNDG COHESIVE 4X5 TAN STRL (GAUZE/BANDAGES/DRESSINGS) ×3 IMPLANT
BNDG ESMARK 4X12 TAN STRL LF (GAUZE/BANDAGES/DRESSINGS) ×3 IMPLANT
CANISTER SUCT 1200ML W/VALVE (MISCELLANEOUS) ×3 IMPLANT
CHLORAPREP W/TINT 26ML (MISCELLANEOUS) ×3 IMPLANT
CUFF TOURN 18 STER (MISCELLANEOUS) IMPLANT
CUFF TOURN 24 STER (MISCELLANEOUS) ×3 IMPLANT
DRAPE C-ARM XRAY 36X54 (DRAPES) ×3 IMPLANT
DRAPE INCISE IOBAN 66X45 STRL (DRAPES) ×3 IMPLANT
DRSG EMULSION OIL 3X8 NADH (GAUZE/BANDAGES/DRESSINGS) ×3 IMPLANT
ELECT CAUTERY BLADE 6.4 (BLADE) ×3 IMPLANT
ELECT CAUTERY NEEDLE 2.0 MIC (NEEDLE) IMPLANT
ELECT CAUTERY NEEDLE TIP 1.0 (MISCELLANEOUS)
ELECT REM PT RETURN 9FT ADLT (ELECTROSURGICAL) ×3
ELECTRODE CAUTERY NEDL TIP 1.0 (MISCELLANEOUS) IMPLANT
ELECTRODE REM PT RTRN 9FT ADLT (ELECTROSURGICAL) ×2 IMPLANT
GAUZE PETRO XEROFOAM 1X8 (MISCELLANEOUS) ×3 IMPLANT
GAUZE SPONGE 4X4 12PLY STRL (GAUZE/BANDAGES/DRESSINGS) ×3 IMPLANT
GLOVE BIOGEL PI IND STRL 9 (GLOVE) ×2 IMPLANT
GLOVE BIOGEL PI INDICATOR 9 (GLOVE) ×1
GLOVE SURG ORTHO 9.0 STRL STRW (GLOVE) ×3 IMPLANT
GOWN SPECIALTY ULTRA XL (MISCELLANEOUS) ×3 IMPLANT
GOWN STRL REUS W/ TWL LRG LVL3 (GOWN DISPOSABLE) ×2 IMPLANT
GOWN STRL REUS W/TWL 2XL LVL3 (GOWN DISPOSABLE) ×3 IMPLANT
GOWN STRL REUS W/TWL LRG LVL3 (GOWN DISPOSABLE) ×1
GOWN STRL REUS W/TWL XL LVL4 (GOWN DISPOSABLE) ×3 IMPLANT
KIT RM TURNOVER STRD PROC AR (KITS) ×3 IMPLANT
NEEDLE FILTER BLUNT 18X 1/2SAF (NEEDLE) ×1
NEEDLE FILTER BLUNT 18X1 1/2 (NEEDLE) ×2 IMPLANT
NS IRRIG 1000ML POUR BTL (IV SOLUTION) ×3 IMPLANT
NS IRRIG 500ML POUR BTL (IV SOLUTION) ×3 IMPLANT
PACK EXTREMITY ARMC (MISCELLANEOUS) ×3 IMPLANT
PACK HIP COMPR (MISCELLANEOUS) IMPLANT
PAD ABD DERMACEA PRESS 5X9 (GAUZE/BANDAGES/DRESSINGS) ×6 IMPLANT
PAD CAST CTTN 4X4 STRL (SOFTGOODS) ×2 IMPLANT
PADDING CAST COTTON 4X4 STRL (SOFTGOODS) ×1
PREP PVP WINGED SPONGE (MISCELLANEOUS) ×3 IMPLANT
STAPLER SKIN PROX 35W (STAPLE) ×3 IMPLANT
STOCKINETTE M/LG 89821 (MISCELLANEOUS) ×3 IMPLANT
STOCKINETTE STRL 4IN 9604848 (GAUZE/BANDAGES/DRESSINGS) ×3 IMPLANT
SUT ETHIBOND NAB CT1 #1 30IN (SUTURE) ×3 IMPLANT
SUT ETHILON 3-0 FS-10 30 BLK (SUTURE) ×3
SUT ETHILON 4-0 (SUTURE) ×1
SUT ETHILON 4-0 FS2 18XMFL BLK (SUTURE) ×2
SUT VIC AB 0 CT1 36 (SUTURE) ×3 IMPLANT
SUT VIC AB 1 CTX 27 (SUTURE) ×6 IMPLANT
SUT VIC AB 2-0 CT1 27 (SUTURE) ×1
SUT VIC AB 2-0 CT1 TAPERPNT 27 (SUTURE) ×2 IMPLANT
SUTURE EHLN 3-0 FS-10 30 BLK (SUTURE) ×2 IMPLANT
SUTURE ETHLN 4-0 FS2 18XMF BLK (SUTURE) ×2 IMPLANT
SYRINGE 10CC LL (SYRINGE) ×3 IMPLANT
WATER STERILE IRR 1000ML POUR (IV SOLUTION) ×3 IMPLANT

## 2015-06-25 NOTE — Transfer of Care (Signed)
Immediate Anesthesia Transfer of Care Note  Patient: Rebecca Page  Procedure(s) Performed: Procedure(s): CARPAL TUNNEL RELEASE (Left) LEFT OLECRANON PLATE REMOVAL (Left)  Patient Location: PACU  Anesthesia Type:General  Level of Consciousness: sedated and responds to stimulation  Airway & Oxygen Therapy: Patient Spontanous Breathing and Patient connected to face mask oxygen  Post-op Assessment: Report given to RN and Post -op Vital signs reviewed and stable  Post vital signs: Reviewed and stable  Last Vitals:  Filed Vitals:   06/25/15 1005 06/25/15 1215  BP: 140/78 106/63  Pulse: 78 90  Temp: 36.8 C 36.7 C  Resp: 16 16    Last Pain: There were no vitals filed for this visit.       Complications: No apparent anesthesia complications

## 2015-06-25 NOTE — Anesthesia Procedure Notes (Signed)
Procedure Name: LMA Insertion Date/Time: 06/25/2015 11:18 AM Performed by: Stormy FabianURTIS, Rourke Mcquitty Pre-anesthesia Checklist: Patient identified, Patient being monitored, Timeout performed, Emergency Drugs available and Suction available Patient Re-evaluated:Patient Re-evaluated prior to inductionOxygen Delivery Method: Circle system utilized Preoxygenation: Pre-oxygenation with 100% oxygen Intubation Type: IV induction Ventilation: Mask ventilation without difficulty LMA: LMA inserted LMA Size: 3.5 Tube type: Oral Number of attempts: 1 Placement Confirmation: positive ETCO2 and breath sounds checked- equal and bilateral Tube secured with: Tape Dental Injury: Teeth and Oropharynx as per pre-operative assessment

## 2015-06-25 NOTE — Discharge Instructions (Addendum)
Keep dressing clean and dry. Work fingers is much as possible. If fingers swell loosen Ace wrap's and reapply Ace wrap. Pain medicine as directed   AMBULATORY SURGERY  DISCHARGE INSTRUCTIONS   1) The drugs that you were given will stay in your system until tomorrow so for the next 24 hours you should not:  A) Drive an automobile B) Make any legal decisions C) Drink any alcoholic beverage   2) You may resume regular meals tomorrow.  Today it is better to start with liquids and gradually work up to solid foods.  You may eat anything you prefer, but it is better to start with liquids, then soup and crackers, and gradually work up to solid foods.   3) Please notify your doctor immediately if you have any unusual bleeding, trouble breathing, redness and pain at the surgery site, drainage, fever, or pain not relieved by medication.    4) Additional Instructions:        Please contact your physician with any problems or Same Day Surgery at [phone], Monday through Friday 6 am to 4 pm, or [place] at [facility] Main number at [phone].

## 2015-06-25 NOTE — Op Note (Signed)
06/25/2015  12:15 PM  PATIENT:  Rebecca Page  70 y.o. female  PRE-OPERATIVE DIAGNOSIS:  LEFT CARPAL TUNNEL SYNDROME, S/P ORIF FX, painful hardware  POST-OPERATIVE DIAGNOSIS:  LEFT CARPAL TUNNEL SYNDROME, S/P ORIF FX  PROCEDURE:  Procedure(s): CARPAL TUNNEL RELEASE (Left) LEFT OLECRANON PLATE REMOVAL (Left)  SURGEON: Leitha SchullerMichael J Janean Eischen, MD  ASSISTANTS: None  ANESTHESIA:   general  EBL:     BLOOD ADMINISTERED:none  DRAINS: none   LOCAL MEDICATIONS USED:  MARCAINE     SPECIMEN:  No Specimen  DISPOSITION OF SPECIMEN:  N/A  COUNTS:  YES  TOURNIQUET:   33 minutes at 250 mmHg  IMPLANTS: None  DICTATION: .Dragon Dictation patient was brought the operating room and after adequate anesthesia was obtained left arm was prepped and draped in sterile fashion with a tourniquet applied the upper arm. After patient identification and timeout procedures were completed, tourniquet was raised and the carpal tunnel was addressed first with the arm on an arm board incision was made 3 metacarpal approximately 1 inch incision made and the subcutaneous tissues tissue spread there was a aberrant muscle overlying the carpal tunnel which was elevated and transcarpal ligament opened with a hemostat placed underneath to protect the underlying structures releases carried out proximal distal and the other appeared to be compression and after release there was a vascular blush to the nerve consistent with adequate release. Masses within the carpal tunnel or excessive flexor tenosynovitis. The wound was irrigated and then infiltrated with 10 cc half percent Sensorcaine for postop analgesia and the wound then closed with simple interrupted 4-0 nylon skin's sutures. The arm board was then removed and the arm brought across the chest where the prior incision was opened and the plate exposed without difficulty. All screws removed without difficulty and the plate elevated from the scar tissue. The fracture site was  healed clinically with palpation as well as using elevator to try to get into the fracture site there was bony union. The wound was irrigated and closed with 2-0 Vicryl subcutaneously and 4-0 nylon skin closure. Xeroform was placed over both incisions followed by 4 x 4's web roll and Ace wrap's tourniquet is let down and patient center comes stable condition  PLAN OF CARE: Discharge to home after PACU  PATIENT DISPOSITION:  PACU - hemodynamically stable.

## 2015-06-25 NOTE — H&P (Signed)
Reviewed paper H+P, will be scanned into chart. No changes noted.  

## 2015-06-25 NOTE — OR Nursing (Signed)
Explanted olecranon plate and screws

## 2015-06-25 NOTE — Anesthesia Preprocedure Evaluation (Signed)
Anesthesia Evaluation  Patient identified by MRN, date of birth, ID band Patient awake    Reviewed: Allergy & Precautions, NPO status , Patient's Chart, lab work & pertinent test results  Airway Mallampati: II  TM Distance: >3 FB     Dental  (+) Chipped   Pulmonary neg pulmonary ROS,    Pulmonary exam normal        Cardiovascular Normal cardiovascular exam+ dysrhythmias Atrial Fibrillation      Neuro/Psych negative neurological ROS  negative psych ROS   GI/Hepatic negative GI ROS, Neg liver ROS,   Endo/Other  negative endocrine ROS  Renal/GU negative Renal ROS  negative genitourinary   Musculoskeletal  (+) Arthritis , Osteoarthritis,    Abdominal Normal abdominal exam  (+)   Peds negative pediatric ROS (+)  Hematology  (+) anemia ,   Anesthesia Other Findings   Reproductive/Obstetrics                             Anesthesia Physical Anesthesia Plan  ASA: III  Anesthesia Plan: General   Post-op Pain Management:    Induction: Intravenous  Airway Management Planned: LMA  Additional Equipment:   Intra-op Plan:   Post-operative Plan: Extubation in OR  Informed Consent: I have reviewed the patients History and Physical, chart, labs and discussed the procedure including the risks, benefits and alternatives for the proposed anesthesia with the patient or authorized representative who has indicated his/her understanding and acceptance.   Dental advisory given  Plan Discussed with: CRNA and Surgeon  Anesthesia Plan Comments:         Anesthesia Quick Evaluation

## 2015-06-26 NOTE — Progress Notes (Signed)
Wrong number for Malachi BondsGloria in computer.

## 2015-06-28 NOTE — Anesthesia Postprocedure Evaluation (Signed)
Anesthesia Post Note  Patient: Rebecca Page  Procedure(s) Performed: Procedure(s) (LRB): CARPAL TUNNEL RELEASE (Left) LEFT OLECRANON PLATE REMOVAL (Left)  Patient location during evaluation: PACU Anesthesia Type: General Level of consciousness: awake and alert and oriented Pain management: pain level controlled Vital Signs Assessment: post-procedure vital signs reviewed and stable Respiratory status: spontaneous breathing Cardiovascular status: blood pressure returned to baseline Anesthetic complications: no    Last Vitals:  Filed Vitals:   06/25/15 1340 06/25/15 1344  BP: 162/65 153/60  Pulse:    Temp:    Resp: 15 16    Last Pain:  Filed Vitals:   06/25/15 1354  PainSc: 3                  Werner Labella

## 2015-07-13 ENCOUNTER — Other Ambulatory Visit: Payer: Self-pay | Admitting: Orthopedic Surgery

## 2015-07-13 DIAGNOSIS — M25561 Pain in right knee: Secondary | ICD-10-CM

## 2015-07-16 ENCOUNTER — Ambulatory Visit: Payer: Medicare Other | Attending: Orthopedic Surgery | Admitting: Occupational Therapy

## 2015-07-16 DIAGNOSIS — M79642 Pain in left hand: Secondary | ICD-10-CM | POA: Diagnosis present

## 2015-07-16 DIAGNOSIS — M25632 Stiffness of left wrist, not elsewhere classified: Secondary | ICD-10-CM | POA: Insufficient documentation

## 2015-07-16 DIAGNOSIS — M25642 Stiffness of left hand, not elsewhere classified: Secondary | ICD-10-CM | POA: Diagnosis present

## 2015-07-16 DIAGNOSIS — M79632 Pain in left forearm: Secondary | ICD-10-CM | POA: Insufficient documentation

## 2015-07-16 DIAGNOSIS — M6281 Muscle weakness (generalized): Secondary | ICD-10-CM | POA: Diagnosis present

## 2015-07-16 DIAGNOSIS — M25622 Stiffness of left elbow, not elsewhere classified: Secondary | ICD-10-CM | POA: Diagnosis present

## 2015-07-16 NOTE — Patient Instructions (Signed)
Heat,scar massage and scar pad for CT scar pad  Tendon glides  Opposition  extention of digits- tapping  Prayer stretch for wrist extention   Elbow AROM for flexion and extention - AAROM end range  10 reps

## 2015-07-16 NOTE — Therapy (Signed)
[place] [facility]ICINE [address] , [place], [address] Phone: [phone]   Fax:  [phone]  Occupational Therapy Treatment  Patient Details  Name: Rebecca Page MRN: [id number] Date of Birth: 06/07/1945 Referring Provider: Dr Rosita KeaMenz  Encounter Date: 07/16/2015      OT End of Session - 07/16/15 1933    Visit Number 1   Number of Visits 12   Date for OT Re-Evaluation 08/27/15   OT Start Time 1257   OT Stop Time 1346   OT Time Calculation (min) 49 min   Activity Tolerance Patient tolerated treatment well   Behavior During Therapy [facility] for tasks assessed/performed      Past Medical History  Diagnosis Date  . Osteoporosis   . Atrial fibrillation (HCC)   . Dysrhythmia   . Arthritis   . Anemia     HX of low platelets    Past Surgical History  Procedure Laterality Date  . Laparoscopic oopherectomy    . Orif elbow fracture Left 02/18/2015    Procedure: OPEN REDUCTION INTERNAL FIXATION (ORIF) ELBOW/OLECRANON FRACTURE;  Surgeon: Kennedy BuckerMichael Menz, MD;  Location: [facility] ORS;  Service: Orthopedics;  Laterality: Left;  . Tonsillectomy    . Breast surgery Right     Breast Biopsy  . Fracture surgery    . Dilation and curettage of uterus    . Carpal tunnel release Left 06/25/2015    Procedure: CARPAL TUNNEL RELEASE;  Surgeon: Kennedy BuckerMichael Menz, MD;  Location: [facility];  Service: Orthopedics;  Laterality: Left;  . Hardware removal Left 06/25/2015    Procedure: LEFT OLECRANON PLATE REMOVAL;  Surgeon: Kennedy BuckerMichael Menz, MD;  Location: [facility];  Service: Orthopedics;  Laterality: Left;    There were no vitals filed for this visit.      Subjective Assessment - 07/16/15 1306    Subjective  I had hardware removed and CTR on the L  in 06/25/15 - pain and flexibility is better bending it - coordination not good in hand but numbness is better - but need to work on ROM    Patient Stated Goals I want to use my L arm again and hand - like sewing,  knitting, volunteer at animal rescue , play with grand kids , gardening    Currently in Pain? Yes   Pain Score 2    Pain Location Wrist   Pain Orientation Left   Pain Descriptors / Indicators Aching   Pain Type Surgical pain            [facility] OT Assessment - 07/16/15 0001    Assessment   Diagnosis L olecranon ORIF hardware removal , L CTR    Referring Provider Dr Rosita KeaMenz   Onset Date 06/25/15   Assessment Pt had hardware at elbow removed and CTR    Home  Environment   Lives With Family   Prior Function   Level of Independence Independent   Vocation Retired   Leisure Pt is R hand dominant - Careers adviservolunteer animal  rescue , pt has dogs , like knitting , sewing,  grandkids, gardening    AROM   Left Elbow Flexion 135   Left Elbow Extension -24   Left Forearm Pronation 85 Degrees   Left Wrist Extension 48 Degrees   Left Wrist Flexion 63 Degrees   Left Hand AROM   L Index  MCP 0-90 80 Degrees   L Index PIP 0-100 100 Degrees   L Long  MCP 0-90 80 Degrees  L Long PIP 0-100 100 Degrees   L Ring  MCP 0-90 85 Degrees   L Ring PIP 0-100 100 Degrees   L Little  MCP 0-90 90 Degrees   L Little PIP 0-100 100 Degrees         heat done to elbow and hand prior to ROM  Review HEP - see HEP                   OT Education - 07/16/15 1932    Education provided Yes   Education Details HEP   Person(s) Educated Patient   Methods Explanation;Demonstration;Tactile cues;Verbal cues;Handout   Comprehension Verbal cues required;Returned demonstration;Verbalized understanding          OT Short Term Goals - 07/16/15 1939    OT SHORT TERM GOAL #2   Title L Wrist AROM in flexion , ext , RD and UD improve with WNL to return to prior level of function    Baseline ext impaired    Time 3   Period Weeks   Status New   OT SHORT TERM GOAL #3   Title Pain on PREE improve with 10 points    Baseline at  eval 13/50   Time 3   Period Weeks   Status New           OT Long Term Goals  - 07/16/15 1940    OT LONG TERM GOAL #2   Title Grip strenght to be assess   Baseline CTS done 3 wks ago   Time 4   Period Weeks   Status New   OT LONG TERM GOAL #3   Title Elbow AROM in flexion and extention improve to [facility] to wash other armpit, fix hair, hold phone,    Baseline extention -24 and flexion 134   Time 6   Period Weeks   Status New   OT LONG TERM GOAL #4   Title Function on PREE improve by at least 12 points   Baseline Function score at eval 20/50   Time 6   Period Weeks   Status New               Plan - 07/16/15 1934    Clinical Impression Statement Pt present about 3 wks postop L olecranon hardwear removal and CTR - Pt show decrease AROM at elbow and wrist on L - as well as increase pain at hand more than elbow - some sensation issue still in L hand but  AROM at elbow and pain better than prior to surgery - still decrease abilty to use L hand in functiional task     Rehab Potential Good   OT Frequency 2x / week   OT Duration 6 weeks   OT Treatment/Interventions Self-care/ADL training;Moist Heat;Fluidtherapy;Parrafin;Therapeutic exercise;Patient/family education;Manual Therapy;DME and/or AE instruction;Passive range of motion;Scar mobilization   Plan Assess how doing with HEP and progress   OT Home Exercise Plan see pt instruction    Consulted and Agree with Plan of Care Patient      Patient will benefit from skilled therapeutic intervention in order to improve the following deficits and impairments:  Decreased coordination, Decreased range of motion, Impaired flexibility, Pain, Impaired UE functional use, Decreased scar mobility, Decreased strength, Decreased knowledge of use of DME  Visit Diagnosis: Muscle weakness - Plan: Ot plan of care cert/re-cert  Stiffness of elbow joint, left - Plan: Ot plan of care cert/re-cert  Pain in left hand - Plan: Ot plan of care cert/re-cert  Stiffness of left wrist, not elsewhere classified - Plan: Ot plan of care  cert/re-cert      G-Codes - 07/20/2015 1943    Functional Assessment Tool Used ROM , PREE, pain , clnical judgement    Functional Limitation Self care   Self Care Current Status (Z6109) At least 40 percent but less than 60 percent impaired, limited or restricted   Self Care Goal Status (U0454) At least 1 percent but less than 20 percent impaired, limited or restricted      Problem List Patient Active Problem List   Diagnosis Date Noted  . Olecranon fracture 02/18/2015    Oletta Cohn OTR/l,CLT  2015/07/20, 7:46 PM  [place] [facility] PHYSICAL AND SPORTS MEDICINE [address] [place], [address] Phone: [phone]   Fax:  [phone]  Name: Rebecca Page MRN: [id number] Date of Birth: [birth year]

## 2015-07-22 ENCOUNTER — Ambulatory Visit: Payer: Medicare Other | Admitting: Occupational Therapy

## 2015-07-22 DIAGNOSIS — M25622 Stiffness of left elbow, not elsewhere classified: Secondary | ICD-10-CM

## 2015-07-22 DIAGNOSIS — M6281 Muscle weakness (generalized): Secondary | ICD-10-CM

## 2015-07-22 DIAGNOSIS — M79632 Pain in left forearm: Secondary | ICD-10-CM

## 2015-07-22 DIAGNOSIS — M25632 Stiffness of left wrist, not elsewhere classified: Secondary | ICD-10-CM

## 2015-07-22 DIAGNOSIS — M79642 Pain in left hand: Secondary | ICD-10-CM

## 2015-07-22 NOTE — Therapy (Signed)
[place] [facility] PH[facility] MEDIC[facility] [address] [place], [place], [address] Phone: [phone]   Fax:  [phone]  Occupational Therapy Treatment  Patient Details  Name: Rebecca Page MRN: [id number] Date of Birth: 11/15/1945 Referring Provider: Dr Rosita KeaMenz  Encounter Date: 07/22/2015      OT End of Session - 07/22/15 1348    Visit Number 2   Number of Visits 12   Date for OT Re-Evaluation 08/27/15   OT Start Time 1249   OT Stop Time 1340   OT Time Calculation (min) 51 min   Activity Tolerance Patient tolerated treatment well   Behavior During Therapy [facility] for tasks assessed/performed      Past Medical History  Diagnosis Date  . Osteoporosis   . Atrial fibrillation (HCC)   . Dysrhythmia   . Arthritis   . Anemia     HX of low platelets    Past Surgical History  Procedure Laterality Date  . Laparoscopic oopherectomy    . Orif elbow fracture Left 02/18/2015    Procedure: OPEN REDUCTION INTERNAL FIXATION (ORIF) ELBOW/OLECRANON FRACTURE;  Surgeon: Kennedy BuckerMichael Menz, MD;  Location: [facility];  Service: Orthopedics;  Laterality: Left;  . Tonsillectomy    . Breast surgery Right     Breast Biopsy  . Fracture surgery    . Dilation and curettage of uterus    . Carpal tunnel release Left 06/25/2015    Procedure: CARPAL TUNNEL RELEASE;  Surgeon: Kennedy BuckerMichael Menz, MD;  Location: [facility];  Service: Orthopedics;  Laterality: Left;  . Hardware removal Left 06/25/2015    Procedure: LEFT OLECRANON PLATE REMOVAL;  Surgeon: Kennedy BuckerMichael Menz, MD;  Location: [facility];  Service: Orthopedics;  Laterality: Left;    There were no vitals filed for this visit.      Subjective Assessment - 07/22/15 1345    Subjective  I [place] the last 2 [place] - 3 x - did my hand exercises - but  I can tell little stiff in my arm - and then hand is better - but I have this little thread coming out of my elbow scar - do you think that is internal stitch   Patient  Stated Goals I want to use my L arm again and hand - like sewing, knitting, volunteer at animal rescue , play with grand kids , gardening    Currently in Pain? Yes   Pain Score 1    Pain Location Elbow   Pain Orientation Left   Pain Descriptors / Indicators Aching                      OT Treatments/Exercises (OP) - 07/22/15 0001    Moist Heat Therapy   Number Minutes Moist Heat 10 Minutes   Moist Heat Location Elbow    Heat done on mat with elbow extention and large heatingpad for stretch   Soft tissue mobs to bicep - stretch into elbow extentoin- myofascial realease   Elbow extetion PROm - end range - then AROM and AAROM end range in supine   Against wall in standing  AROM and Extention /flexion -  All 10 -12 reps   Soft tissue mobs to palm and over CT scar to decrease tightness , scar tissue and increase ROM - decrease pain - Graston tool 2 and 4 - for sweeping , scooping and brushing     AROM with 1 lbs weight for elbow flexion/ext in standing 3 positions -  6 rerps each -needed mod V/c to do wrist to wall - not compensate with wrist flexion or UD  Add table slides for wrist extention - stop when feeling pull or sensory changes in digist  - ;less than 1/10 And then tendon glides and Med N glides                  OT Education - 07/22/15 1348    Education provided Yes   Education Details HEP   Person(s) Educated Patient   Methods Explanation;Demonstration;Tactile cues;Verbal cues   Comprehension Returned demonstration;Verbalized understanding;Verbal cues required          OT Short Term Goals - 07/16/15 1939    OT SHORT TERM GOAL #2   Title L Wrist AROM in flexion , ext , RD and UD improve with WNL to return to prior level of function    Baseline ext impaired    Time 3   Period Weeks   Status New   OT SHORT TERM GOAL #3   Title Pain on PREE improve with 10 points    Baseline at  eval 13/50   Time 3   Period Weeks   Status New            OT Long Term Goals - 07/16/15 1940    OT LONG TERM GOAL #2   Title Grip strenght to be assess   Baseline CTS done 3 wks ago   Time 4   Period Weeks   Status New   OT LONG TERM GOAL #3   Title Elbow AROM in flexion and extention improve to [facility] to wash other armpit, fix hair, hold phone,    Baseline extention -24 and flexion 134   Time 6   Period Weeks   Status New   OT LONG TERM GOAL #4   Title Function on PREE improve by at least 12 points   Baseline Function score at eval 20/50   Time 6   Period Weeks   Status New               Plan - 07/22/15 1349    Clinical Impression Statement Pt making progress in elbow flexion and extnetion - and scar healing - did initiate some weight bearing thru arm , 1 lbs weight - pt did had little internal stitch it appear- but when attempted to pull 5 x - felt stuck with little pocket on side - pt to phone MD to check if they can pull  -    Rehab Potential Good   OT Frequency 2x / week   OT Duration 6 weeks   OT Treatment/Interventions Self-care/ADL training;Moist Heat;Fluidtherapy;Parrafin;Therapeutic exercise;Patient/family education;Manual Therapy;DME and/or AE instruction;Passive range of motion;Scar mobilization   Plan progress in exte/flexion - scar tissue - stitch   OT Home Exercise Plan see pt instruction    Consulted and Agree with Plan of Care Patient      Patient will benefit from skilled therapeutic intervention in order to improve the following deficits and impairments:  Decreased coordination, Decreased range of motion, Impaired flexibility, Pain, Impaired UE functional use, Decreased scar mobility, Decreased strength, Decreased knowledge of use of DME  Visit Diagnosis: Muscle weakness  Stiffness of elbow joint, left  Pain in left hand  Stiffness of left wrist, not elsewhere classified  Pain of left forearm    Problem List Patient Active Problem List   Diagnosis Date Noted  . Olecranon fracture  02/18/2015    Oletta Cohn OTR/L,CLT  07/22/2015, 1:53 PM  [place] [facility] PH[facility]TS [facility]ICINE [address], [place], [address] Phone: [phone]   Fax:  [phone]  Name: Rebecca Page MRN: [id number] Date of Birth: Mar 20, 1945

## 2015-07-22 NOTE — Patient Instructions (Signed)
Add passive stretch for elbow extention with heatingpad with some weight for gentle stretch 10 min  Then 1 lbs for elbow extention and flexion against wall - 5 reps - for each of 3 positions  and then Add table slides for wrist extention with some weight bearing -  Same HEP too cont

## 2015-07-24 ENCOUNTER — Ambulatory Visit: Payer: Medicare Other | Admitting: Occupational Therapy

## 2015-07-24 DIAGNOSIS — M79642 Pain in left hand: Secondary | ICD-10-CM

## 2015-07-24 DIAGNOSIS — M25622 Stiffness of left elbow, not elsewhere classified: Secondary | ICD-10-CM

## 2015-07-24 DIAGNOSIS — M25632 Stiffness of left wrist, not elsewhere classified: Secondary | ICD-10-CM

## 2015-07-24 DIAGNOSIS — M6281 Muscle weakness (generalized): Secondary | ICD-10-CM | POA: Diagnosis not present

## 2015-07-24 DIAGNOSIS — M79632 Pain in left forearm: Secondary | ICD-10-CM

## 2015-07-24 DIAGNOSIS — M25642 Stiffness of left hand, not elsewhere classified: Secondary | ICD-10-CM

## 2015-07-24 NOTE — Therapy (Signed)
[place]TS MEDICINE [address] [place], [address] Phone: [phone]   Fax:  [phone]  Occupational Therapy Treatment  Patient Details  Name: Rebecca Page MRN: [id number] Date of Birth: 1945-06-17 Referring Provider: Dr Rosita Kea  Encounter Date: 07/24/2015      OT End of Session - 07/24/15 1300    Visit Number 3   Number of Visits 12   Date for OT Re-Evaluation 08/27/15   OT Start Time 1055   OT Stop Time 1153   OT Time Calculation (min) 58 min   Activity Tolerance Patient tolerated treatment well   Behavior During Therapy [facility] for tasks assessed/performed      Past Medical History  Diagnosis Date  . Osteoporosis   . Atrial fibrillation (HCC)   . Dysrhythmia   . Arthritis   . Anemia     HX of low platelets    Past Surgical History  Procedure Laterality Date  . Laparoscopic oopherectomy    . Orif elbow fracture Left 02/18/2015    Procedure: OPEN REDUCTION INTERNAL FIXATION (ORIF) ELBOW/OLECRANON FRACTURE;  Surgeon: Kennedy Bucker, MD;  Location: [facility];  Service: Orthopedics;  Laterality: Left;  . Tonsillectomy    . Breast surgery Right     Breast Biopsy  . Fracture surgery    . Dilation and curettage of uterus    . Carpal tunnel release Left 06/25/2015    Procedure: CARPAL TUNNEL RELEASE;  Surgeon: Kennedy Bucker, MD;  Location: [facility];  Service: Orthopedics;  Laterality: Left;  . Hardware removal Left 06/25/2015    Procedure: LEFT OLECRANON PLATE REMOVAL;  Surgeon: Kennedy Bucker, MD;  Location: [facility];  Service: Orthopedics;  Laterality: Left;    There were no vitals filed for this visit.      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 Yes   Pain Score 3    Pain Location Hand   Pain Orientation Left   Pain Descriptors / Indicators Aching   Pain Type Surgical pain      Heat done to elbow while in fluido and doing Graston on hand  Fluido therapy to L hand and wrist to decrease pain and increase ROM in digits and wrist at [facility]   Soft tissue mobs to bicep - stretch into elbow extentoin- myofascial realease   Elbow extetion PROm - end range - then AROM and AAROM end range in supine   Against wall in standing AROM and Extention /flexion -  All 10 -12 reps  Add YTB - with enlarge handle for elbow extention 2 positions - and attempted flexion - but hold off - cont 1 lbs weight  Soft tissue mobs to palm and over CT scar to decrease tightness , scar tissue and increase ROM - decrease pain - Graston tool 2 and 4 - for sweeping , scooping and brushing  And some vibration this date - did get softer - and ice done at end -  With decrease edema and pain    AROM with 1 lbs weight  for elbow flexion standing 3 positions - 6 rerps each -needed mod V/c to do wrist to wall - not compensate with wrist flexion or UD  Add table slides for wrist extention - stop when feeling pull or sensory changes in digist - ;less than 1/10 And then tendon glides and Med N glides                          OT Education - 07/24/15 1259    Education provided Yes   Education Details HEP   Person(s) Educated Patient   Methods Explanation;Demonstration;Tactile cues;Verbal cues   Comprehension Verbal cues required;Returned demonstration          OT Short Term Goals - 07/16/15 1939    OT SHORT TERM GOAL #2   Title L Wrist AROM in flexion , ext , RD and UD improve with WNL to return to prior level of function    Baseline ext impaired    Time 3   Period Weeks   Status New   OT SHORT TERM GOAL #3   Title Pain on PREE improve with 10 points    Baseline at   eval 13/50   Time 3   Period Weeks   Status New           OT Long Term Goals - 07/16/15 1940    OT LONG TERM GOAL #2   Title Grip strenght to be assess   Baseline CTS done 3 wks ago   Time 4   Period Weeks   Status New   OT LONG TERM GOAL #3   Title Elbow AROM in flexion and [facility]ention [facility] to [facility] other armpit, fix hair, hold phone,    Baseline extention -24 and flexion 134   Time 6   Period Weeks   Status New   OT LONG TERM GOAL #4   Title Function on PREE improve by at least 12 points   Baseline Function score at eval 20/50   Time 6   Period Weeks   Status New               Plan - 07/24/15 1301    Clinical Impression Statement Pt CT scar and hand more swollen , hard and tight this date- but improved end of session - add ice to HEP and for elbow extention YTB - cotn to decrease pain , increaes ROM , strength   Rehab Potential Good   OT Frequency 2x / week   OT Duration 6 weeks   OT Treatment/Interventions Self-care/ADL training;Moist Heat;Fluidtherapy;Parrafin;Therapeutic exercise;Patient/family education;Manual Therapy;DME and/or AE instruction;Passive range of motion;Scar mobilization   Plan assess pain - CT scar - progress   OT Home Exercise Plan see pt instruction    Consulted and Agree with Plan of Care Patient      Patient will benefit from skilled therapeutic intervention in order to improve the following deficits and impairments:  Decreased coordination, Decreased range of motion, Impaired flexibility, Pain, Impaired UE functional use, Decreased scar mobility, Decreased strength, Decreased knowledge of use of DME  Visit Diagnosis: Muscle weakness  Stiffness of elbow joint, left  Pain in left hand  Stiffness of left wrist, not elsewhere classified  Pain of left forearm  Stiffness of hand joint, left    Problem List Patient Active Problem List   Diagnosis Date Noted  . Olecranon fracture 02/18/2015    Rebecca Page, Rebecca Page  OTR/L,CLT 07/24/2015, 1:07 PM  [place]  [facility] [address] [place], [address] Phone: [phone]   Fax:  [phone]  Name: Rebecca Page MRN: [id number] Date of Birth: May 15, 1945

## 2015-07-24 NOTE — Patient Instructions (Signed)
Same HEP - add ice to do over CT as needed and ed of HEP session And YTB for elbow extention end range - standing - 2 positions

## 2015-07-28 ENCOUNTER — Ambulatory Visit: Payer: Medicare Other | Admitting: Occupational Therapy

## 2015-07-28 DIAGNOSIS — M6281 Muscle weakness (generalized): Secondary | ICD-10-CM

## 2015-07-28 DIAGNOSIS — M79642 Pain in left hand: Secondary | ICD-10-CM

## 2015-07-28 DIAGNOSIS — M25622 Stiffness of left elbow, not elsewhere classified: Secondary | ICD-10-CM

## 2015-07-28 DIAGNOSIS — M25642 Stiffness of left hand, not elsewhere classified: Secondary | ICD-10-CM

## 2015-07-28 DIAGNOSIS — M25632 Stiffness of left wrist, not elsewhere classified: Secondary | ICD-10-CM

## 2015-07-28 NOTE — Therapy (Signed)
[place] [facility] PH[facility] AND SPORTS MEDICINE [address] [place], [place], [address] Phone: [phone]   Fax:  [phone]  Occupational Therapy Treatment  Patient Details  Name: Rebecca Page MRN: [id number] Date of Birth: 05/06/1945 Referring Provider: Dr Rosita KeaMenz  Encounter Date: 07/28/2015      OT End of Session - 07/28/15 1836    Visit Number 4   Number of Visits 12   Date for OT Re-Evaluation 08/27/15   OT Start Time 1340   OT Stop Time 1433   OT Time Calculation (min) 53 min   Activity Tolerance Patient tolerated treatment well   Behavior During [facility] for tasks assessed/performed      Past Medical History  Diagnosis Date  . Osteoporosis   . Atrial fibrillation (HCC)   . Dysrhythmia   . Arthritis   . Anemia     HX of low platelets    Past Surgical History  Procedure Laterality Date  . Laparoscopic oopherectomy    . Orif elbow fracture Left 02/18/2015    Procedure: OPEN REDUCTION INTERNAL FIXATION (ORIF) ELBOW/OLECRANON FRACTURE;  Surgeon: Kennedy BuckerMichael Menz, MD;  Location: [facility];  Service: Orthopedics;  Laterality: Left;  . Tonsillectomy    . Breast surgery Right     Breast Biopsy  . Fracture surgery    . Dilation and curettage of uterus    . Carpal tunnel release Left 06/25/2015    Procedure: CARPAL TUNNEL RELEASE;  Surgeon: Kennedy BuckerMichael Menz, MD;  Location: [facility];  Service: Orthopedics;  Laterality: Left;  . Hardware removal Left 06/25/2015    Procedure: LEFT OLECRANON PLATE REMOVAL;  Surgeon: Kennedy BuckerMichael Menz, MD;  Location: [facility];  Service: Orthopedics;  Laterality: Left;    There were no vitals filed for this visit.      Subjective Assessment - 07/28/15 1341    Subjective  Elbow doing fine - I do like that band you gave me - fingers and wrist still tight - andwrist hurting some    Patient Stated Goals I want to use my L arm again and hand - like sewing, knitting, volunteer at animal rescue , play with grand kids ,  gardening    Currently in Pain? Yes   Pain Score 2    Pain Location Wrist   Pain Orientation Left   Pain Descriptors / Indicators Aching      Heat done to elbow and constrast to hand and wrist to decrease pain and edema in CT at wrist and palm  11 min    Elbow extetion PROm - end range - then AROM and AAROM end range in supine   2 kg ball for elbow extention bilateral in supine with shoulders at 90 degrees  12 reps - pain less than 2/10  Add 1 lbs for HEP L elbow extention - supine - shoulders at 90 degrees    YTB - with enlarge handle for shoulder retraction and shoulders extention  12 reps each - add to HEP   Soft tissue mobs to palm and over CT scar to decrease tightness , scar tissue and increase ROM - decrease pain - Graston tool 2 and 4 - for sweeping , scooping and brushing  And some vibration this date - did get softer - and ice done at end -  With decrease edema and pain  Decrease stiffness in digits  OT Education - 07/28/15 1822    Education provided Yes   Education Details HEP   Person(s) Educated Patient   Methods Explanation;Demonstration;Tactile cues;Verbal cues   Comprehension Returned demonstration;Verbalized understanding;Verbal cues required          OT Short Term Goals - 07/16/15 1939    OT SHORT TERM GOAL #2   Title L Wrist AROM in flexion , ext , RD and UD improve with WNL to return to prior level of function    Baseline ext impaired    Time 3   Period Weeks   Status New   OT SHORT TERM GOAL #3   Title Pain on PREE improve with 10 points    Baseline at  eval 13/50   Time 3   Period Weeks   Status New           OT Long Term Goals - 07/16/15 1940    OT LONG TERM GOAL #2   Title Grip strenght to be assess   Baseline CTS done 3 wks ago   Time 4   Period Weeks   Status New   OT LONG TERM GOAL #3   Title Elbow AROM in flexion and extention improve to [facility] to wash other armpit, fix hair,  hold phone,    Baseline extention -24 and flexion 134   Time 6   Period Weeks   Status New   OT LONG TERM GOAL #4   Title Function on PREE improve by at least 12 points   Baseline Function score at eval 20/50   Time 6   Period Weeks   Status New               Plan - 07/28/15 1837    Clinical Impression Statement Pt show decrease swelling and hardness at CT scar this date - still adhere and thick over scar - butdecrease ater vibration and Graston , decrease tightenss - in crease elbow extention  and able to cont with strengthening    Rehab Potential Good   OT Frequency 2x / week   OT Duration 4 weeks   OT Treatment/Interventions Self-care/ADL training;Moist Heat;Fluidtherapy;Parrafin;Therapeutic exercise;Patient/family education;Manual Therapy;DME and/or AE instruction;Passive range of motion;Scar mobilization   Plan assess pain and swelling over CT scar    OT Home Exercise Plan see pt instruction    Consulted and Agree with Plan of Care Patient      Patient will benefit from skilled therapeutic intervention in order to improve the following deficits and impairments:  Decreased coordination, Decreased range of motion, Impaired flexibility, Pain, Impaired UE functional use, Decreased scar mobility, Decreased strength, Decreased knowledge of use of DME  Visit Diagnosis: Muscle weakness  Stiffness of elbow joint, left  Pain in left hand  Stiffness of left wrist, not elsewhere classified  Stiffness of hand joint, left    Problem List Patient Active Problem List   Diagnosis Date Noted  . Olecranon fracture 02/18/2015    Oletta Cohn OTR/L,CLT 07/28/2015, 6:40 PM  [place] [facility] PHYSICAL AND SPORTS MEDICINE [address], [place], [address] Phone: [phone]   Fax:  [phone]  Name: Rebecca Page MRN: [id number] Date of Birth: [birth year]

## 2015-07-30 ENCOUNTER — Ambulatory Visit: Payer: Medicare Other | Admitting: Occupational Therapy

## 2015-07-31 ENCOUNTER — Ambulatory Visit
Admission: RE | Admit: 2015-07-31 | Discharge: 2015-07-31 | Disposition: A | Discharge status: Home / Self Care | Payer: Medicare Other | Source: Ambulatory Visit | Attending: Orthopedic Surgery | Admitting: Orthopedic Surgery

## 2015-07-31 DIAGNOSIS — M25561 Pain in right knee: Secondary | ICD-10-CM | POA: Diagnosis present

## 2015-07-31 DIAGNOSIS — M1711 Unilateral primary osteoarthritis, right knee: Secondary | ICD-10-CM | POA: Insufficient documentation

## 2015-08-06 ENCOUNTER — Ambulatory Visit: Payer: Medicare Other | Admitting: Occupational Therapy

## 2015-08-06 DIAGNOSIS — M6281 Muscle weakness (generalized): Secondary | ICD-10-CM | POA: Diagnosis not present

## 2015-08-06 DIAGNOSIS — M25642 Stiffness of left hand, not elsewhere classified: Secondary | ICD-10-CM

## 2015-08-06 DIAGNOSIS — M25622 Stiffness of left elbow, not elsewhere classified: Secondary | ICD-10-CM

## 2015-08-06 DIAGNOSIS — M25632 Stiffness of left wrist, not elsewhere classified: Secondary | ICD-10-CM

## 2015-08-06 DIAGNOSIS — M79642 Pain in left hand: Secondary | ICD-10-CM

## 2015-08-06 NOTE — Therapy (Signed)
[place] [facility] [address] [place], [place], [address] Phone: [phone]   Fax:  [phone]  Occupational Therapy Treatment  Patient Details  Name: Rebecca Page MRN: [id number] Date of Birth: 06/28/1945 Referring Provider: Dr Rosita KeaMenz  Encounter Date: 08/06/2015      OT End of Session - 08/06/15 1149    Visit Number 5   Number of Visits 12   Date for OT Re-Evaluation 08/27/15   OT Start Time 1128   OT Stop Time 1219   OT Time Calculation (min) 51 min   Activity Tolerance Patient tolerated treatment well   Behavior During Therapy [facility] for tasks assessed/performed      Past Medical History  Diagnosis Date  . Osteoporosis   . Atrial fibrillation (HCC)   . Dysrhythmia   . Arthritis   . Anemia     HX of low platelets    Past Surgical History  Procedure Laterality Date  . Laparoscopic oopherectomy    . Orif elbow fracture Left 02/18/2015    Procedure: OPEN REDUCTION INTERNAL FIXATION (ORIF) ELBOW/OLECRANON FRACTURE;  Surgeon: Kennedy BuckerMichael Menz, MD;  Location: [facility];  Service: Orthopedics;  Laterality: Left;  . Tonsillectomy    . Breast surgery Right     Breast Biopsy  . Fracture surgery    . Dilation and curettage of uterus    . Carpal tunnel release Left 06/25/2015    Procedure: CARPAL TUNNEL RELEASE;  Surgeon: Kennedy BuckerMichael Menz, MD;  Location: [facility];  Service: Orthopedics;  Laterality: Left;  . Hardware removal Left 06/25/2015    Procedure: LEFT OLECRANON PLATE REMOVAL;  Surgeon: Kennedy BuckerMichael Menz, MD;  Location: [facility];  Service: Orthopedics;  Laterality: Left;    There were no vitals filed for this visit.      Subjective Assessment - 08/06/15 1146    Subjective   Hand over all better - fingers stiff in am - elbow sore - some sharp shooting pain - pins and needles is getting better    Patient Stated Goals I want to use my L arm again and hand - like sewing, knitting, volunteer at animal rescue , play with grand  kids , gardening    Currently in Pain? Yes   Pain Score 1    Pain Location Elbow   Pain Orientation Left   Pain Descriptors / Indicators Aching   Pain Type Surgical pain      Heat done to elbow  For extention stretch 10 min    Elbow extetion PROm - end range - 3 positions in supine Isometric on supine in end range - into OT hand  AROM  Flexion <> extention  1 lbs for elbow flexion in standing - 3 positions  In supine - elbow flexion and extnetion - at 90 degrees shoulder flexion  Shoulder flexion and puches 10reps each     cont YTB - with enlarge handle for shoulder retraction and shoulders extention  12 reps each - add to HEP   Soft tissue mobs to palm and over CT scar to decrease tightness , scar tissue and increase ROM - decrease pain - Graston tool 2 and 4 - for sweeping , scooping and brushing   UBE 5 min - change every min change direction - no pain - and did get good stretch for elbow extention  OT Education - 08/06/15 1149    Education provided Yes   Education Details HEP   Person(s) Educated Patient   Methods Explanation;Verbal cues;Tactile cues;Demonstration   Comprehension Verbalized understanding;Returned demonstration;Verbal cues required          OT Short Term Goals - 07/16/15 1939    OT SHORT TERM GOAL #2   Title L Wrist AROM in flexion , ext , RD and UD improve with WNL to return to prior level of function    Baseline ext impaired    Time 3   Period Weeks   Status New   OT SHORT TERM GOAL #3   Title Pain on PREE improve with 10 points    Baseline at  eval 13/50   Time 3   Period Weeks   Status New           OT Long Term Goals - 07/16/15 1940    OT LONG TERM GOAL #2   Title Grip strenght to be assess   Baseline CTS done 3 wks ago   Time 4   Period Weeks   Status New   OT LONG TERM GOAL #3   Title Elbow AROM in flexion and extention improve to [facility] to wash other armpit, fix hair, hold  phone,    Baseline extention -24 and flexion 134   Time 6   Period Weeks   Status New   OT LONG TERM GOAL #4   Title Function on PREE improve by at least 12 points   Baseline Function score at eval 20/50   Time 6   Period Weeks   Status New               Plan - 08/06/15 1233    Clinical Impression Statement Pt show decrease scar tissue and soft tissue tightness- extention of elbow increassing - able to tolerate  1 lbs easy - pt to increae sets and increase use    Rehab Potential Good   OT Frequency 2x / week   OT Duration 4 weeks   OT Treatment/Interventions Self-care/ADL training;Moist Heat;Fluidtherapy;Parrafin;Therapeutic exercise;Patient/family education;Manual Therapy;DME and/or AE instruction;Passive range of motion;Scar mobilization   Plan assess scar , pain and elbow ext/ grip strength   OT Home Exercise Plan see pt instruction    Consulted and Agree with Plan of Care Patient      Patient will benefit from skilled therapeutic intervention in order to improve the following deficits and impairments:  Decreased coordination, Decreased range of motion, Impaired flexibility, Pain, Impaired UE functional use, Decreased scar mobility, Decreased strength, Decreased knowledge of use of DME  Visit Diagnosis: Muscle weakness  Stiffness of elbow joint, left  Pain in left hand  Stiffness of left wrist, not elsewhere classified  Stiffness of hand joint, left    Problem List Patient Active Problem List   Diagnosis Date Noted  . Olecranon fracture 02/18/2015    Oletta CohnuPreez, Suan Pyeatt OTR/L,CLT 08/06/2015, 12:37 PM  [place] [facility] PHYSICAL AND SPORTS MEDICINE [address] [place], [place], [address] Phone: [phone]   Fax:  [phone]  Name: Rebecca Page MRN: [id number] Date of Birth: 01/28/1946

## 2015-08-13 ENCOUNTER — Ambulatory Visit: Payer: Medicare Other | Attending: Orthopedic Surgery | Admitting: Occupational Therapy

## 2015-08-13 DIAGNOSIS — R262 Difficulty in walking, not elsewhere classified: Secondary | ICD-10-CM | POA: Diagnosis present

## 2015-08-13 DIAGNOSIS — M79632 Pain in left forearm: Secondary | ICD-10-CM | POA: Diagnosis present

## 2015-08-13 DIAGNOSIS — M25642 Stiffness of left hand, not elsewhere classified: Secondary | ICD-10-CM | POA: Diagnosis present

## 2015-08-13 DIAGNOSIS — M25632 Stiffness of left wrist, not elsewhere classified: Secondary | ICD-10-CM

## 2015-08-13 DIAGNOSIS — M79642 Pain in left hand: Secondary | ICD-10-CM | POA: Diagnosis present

## 2015-08-13 DIAGNOSIS — M25561 Pain in right knee: Secondary | ICD-10-CM | POA: Insufficient documentation

## 2015-08-13 DIAGNOSIS — M25622 Stiffness of left elbow, not elsewhere classified: Secondary | ICD-10-CM

## 2015-08-13 DIAGNOSIS — M6281 Muscle weakness (generalized): Secondary | ICD-10-CM | POA: Insufficient documentation

## 2015-08-13 NOTE — Therapy (Signed)
[place] [facility]ICINE [address] [place], [place], [address] Phone: [phone]   Fax:  [phone]  Occupational Therapy Treatment  Patient Details  Name: Rebecca Page MRN: [id number] Date of Birth: 03/21/1945 Referring Provider: Dr Rosita KeaMenz  Encounter Date: 08/13/2015      OT End of Session - 08/13/15 1304    Visit Number 6   Number of Visits 12   Date for OT Re-Evaluation 08/27/15   OT Start Time 1300   OT Stop Time 1349   OT Time Calculation (min) 49 min   Activity Tolerance Patient tolerated treatment well   Behavior During Therapy [facility] for tasks assessed/performed      Past Medical History  Diagnosis Date  . Osteoporosis   . Atrial fibrillation (HCC)   . Dysrhythmia   . Arthritis   . Anemia     HX of low platelets    Past Surgical History  Procedure Laterality Date  . Laparoscopic oopherectomy    . Orif elbow fracture Left 02/18/2015    Procedure: OPEN REDUCTION INTERNAL FIXATION (ORIF) ELBOW/OLECRANON FRACTURE;  Surgeon: Kennedy BuckerMichael Menz, MD;  Location: [facility];  Service: Orthopedics;  Laterality: Left;  . Tonsillectomy    . Breast surgery Right     Breast Biopsy  . Fracture surgery    . Dilation and curettage of uterus    . Carpal tunnel release Left 06/25/2015    Procedure: CARPAL TUNNEL RELEASE;  Surgeon: Kennedy BuckerMichael Menz, MD;  Location: [facility];  Service: Orthopedics;  Laterality: Left;  . Hardware removal Left 06/25/2015    Procedure: LEFT OLECRANON PLATE REMOVAL;  Surgeon: Kennedy BuckerMichael Menz, MD;  Location: [facility];  Service: Orthopedics;  Laterality: Left;    There were no vitals filed for this visit.      Subjective Assessment - 08/13/15 1258    Subjective  Started in the pool - but I think I did something in pool - put on some mittens - should not - more pain in wrist- so you need to tell me    Patient Stated Goals I want to use my L arm again and hand - like sewing, knitting, volunteer at animal rescue ,  play with grand kids , gardening    Currently in Pain? Yes   Pain Score 2    Pain Location Wrist   Pain Orientation Left   Pain Descriptors / Indicators Tightness   Pain Type Surgical pain            [facility] OT Assessment - 08/13/15 0001    Strength   Right Hand Grip (lbs) 55   Right Hand Lateral Pinch 16 lbs   Right Hand 3 Point Pinch 15 lbs   Left Hand Grip (lbs) 29   Left Hand Lateral Pinch 12 lbs   Left Hand 3 Point Pinch 10 lbs       Elbow extention PROm - end range - 3 positions in supine  AROM Flexion <> extention  2 lbs for elbow flexion in standing - 3 positions  In supine - elbow flexion and extnetion - at 90 degrees shoulder flexion  and punches - 2 lbs  Shoulder flexion and puches 10reps each  2 lbs in supine   Ball on wall - open hand and fist - 3 x 45 sec  update to RTB - with enlarge handle for shoulder retraction and shoulders extention  12 reps each - add to HEP And elbow flexion RTB 8 reps - 2  position  YTB double for  Elbow extention 8 reps   Measured grip and prehension strength as well as elbow AROM   Teal putty grip  3 point grip  Lat grip  10 reps   [place] at 20% for 5 min , 1.0 intensity for palmar scar at end of session for pain and inflammation over CT    UBE 8 min - change every min change direction - no pain - and did get good stretch for elbow extention                       OT Education - 08/13/15 1304    Education provided Yes   Education Details HEP   Person(s) Educated Patient   Methods Explanation;Demonstration;Tactile cues;Verbal cues;Handout   Comprehension Verbal cues required;Returned demonstration;Verbalized understanding          OT Short Term Goals - 07/16/15 1939    OT SHORT TERM GOAL #2   Title L Wrist AROM in flexion , ext , RD and UD improve with WNL to return to prior level of function    Baseline ext impaired    Time 3   Period Weeks   Status New   OT SHORT TERM GOAL #3   Title  Pain on PREE improve with 10 points    Baseline at  eval 13/50   Time 3   Period Weeks   Status New           OT Long Term Goals - 07/16/15 1940    OT LONG TERM GOAL #2   Title Grip strenght to be assess   Baseline CTS done 3 wks ago   Time 4   Period Weeks   Status New   OT LONG TERM GOAL #3   Title Elbow AROM in flexion and extention improve to [facility] to wash other armpit, fix hair, hold phone,    Baseline extention -24 and flexion 134   Time 6   Period Weeks   Status New   OT LONG TERM GOAL #4   Title Function on PREE improve by at least 12 points   Baseline Function score at eval 20/50   Time 6   Period Weeks   Status New               Plan - 08/13/15 1305    Clinical Impression Statement Pt making progress in elbow AROM ,grip and prehension  - upgrade HEP to 2 lbs weigth and RTB - cont to strength  L UE -elbow and  decrease disomfort at CTR    Rehab Potential Good   OT Frequency 2x / week   OT Duration 4 weeks   OT Treatment/Interventions Self-care/ADL training;Moist Heat;Fluidtherapy;Parrafin;Therapeutic exercise;Patient/family education;Manual Therapy;DME and/or AE instruction;Passive range of motion;Scar mobilization   Plan how she is doing HEP RTB and 2 lbs    OT Home Exercise Plan see pt instruction    Consulted and Agree with Plan of Care Patient      Patient will benefit from skilled therapeutic intervention in order to improve the following deficits and impairments:  Decreased coordination, Decreased range of motion, Impaired flexibility, Pain, Impaired UE functional use, Decreased scar mobility, Decreased strength, Decreased knowledge of use of DME  Visit Diagnosis: Muscle weakness  Stiffness of elbow joint, left  Pain in left hand  Stiffness of left wrist, not elsewhere classified  Stiffness of hand joint, left  Pain of left forearm    Problem List Patient  Active Problem List   Diagnosis Date Noted  . Olecranon fracture 02/18/2015     Oletta CohnuPreez, Twila Rappa OTR/L,CLT  08/13/2015, 6:56 PM  [place] [facility] [address] [place], [place], [address] Phone: [phone]   Fax:  [phone]  Name: Rebecca Page MRN: [id number] Date of Birth: 05/31/1945

## 2015-08-13 NOTE — Patient Instructions (Addendum)
PROM for elbow flexion to HEP   AROM Flexion <> extention  In supine - elbow flexion and extnetion - at 90 degrees shoulder flexion  and punches - 2 lbs  Shoulder flexion and puches 10reps each  2 lbs in supine   Ball on wall - open hand and fist - 3 x 45 sec  update to RTB - with enlarge handle for shoulder retraction and shoulders extention  12 reps each - add to HEP And elbow flexion RTB 8 reps - 2 position  YTB double for  Elbow extention 8 reps   Teal putty grip  3 point grip  Lat grip  10 reps

## 2015-08-21 ENCOUNTER — Ambulatory Visit: Payer: Medicare Other | Admitting: Occupational Therapy

## 2015-08-21 DIAGNOSIS — M6281 Muscle weakness (generalized): Secondary | ICD-10-CM | POA: Diagnosis not present

## 2015-08-21 DIAGNOSIS — M25622 Stiffness of left elbow, not elsewhere classified: Secondary | ICD-10-CM

## 2015-08-21 DIAGNOSIS — M25632 Stiffness of left wrist, not elsewhere classified: Secondary | ICD-10-CM

## 2015-08-21 DIAGNOSIS — M79632 Pain in left forearm: Secondary | ICD-10-CM

## 2015-08-21 DIAGNOSIS — M79642 Pain in left hand: Secondary | ICD-10-CM

## 2015-08-21 DIAGNOSIS — M25642 Stiffness of left hand, not elsewhere classified: Secondary | ICD-10-CM

## 2015-08-21 NOTE — Patient Instructions (Addendum)
Same HEP but green putty  And same elbow HEP - green band upgrade Elbow PROM extention prior to green band  And do forearm extensor stretch after putty  HEP  And cont with scar massage

## 2015-08-21 NOTE — Therapy (Signed)
[facility] PHYS[facility]ORTS MEDICINE [address] [place], [address] Phone: [phone]   Fax:  [phone]  Occupational Therapy Treatment  Patient Details  Name: Rebecca Page MRN: [id number] Date of Birth: 1945/06/18 Referring Provider: Dr Rosita Kea  Encounter Date: 08/21/2015      OT End of Session - 08/21/15 1506    Visit Number 7   Number of Visits 12   Date for OT Re-Evaluation 08/27/15   OT Start Time 1246   OT Stop Time 1333   OT Time Calculation (min) 47 min   Activity Tolerance Patient tolerated treatment well   Behavior During Therapy [facility] for tasks assessed/performed      Past Medical History  Diagnosis Date  . Osteoporosis   . Atrial fibrillation (HCC)   . Dysrhythmia   . Arthritis   . Anemia     HX of low platelets    Past Surgical History  Procedure Laterality Date  . Laparoscopic oopherectomy    . Orif elbow fracture Left 02/18/2015    Procedure: OPEN REDUCTION INTERNAL FIXATION (ORIF) ELBOW/OLECRANON FRACTURE;  Surgeon: Kennedy Bucker, MD;  Location: [facility] ORS;  Service: Orthopedics;  Laterality: Left;  . Tonsillectomy    . Breast surgery Right     Breast Biopsy  . Fracture surgery    . Dilation and curettage of uterus    . Carpal tunnel release Left 06/25/2015    Procedure: CARPAL TUNNEL RELEASE;  Surgeon: Kennedy Bucker, MD;  Location: [facility] ORS;  Service: Orthopedics;  Laterality: Left;  . Hardware removal Left 06/25/2015    Procedure: LEFT OLECRANON PLATE REMOVAL;  Surgeon: Kennedy Bucker, MD;  Location: [facility] ORS;  Service: Orthopedics;  Laterality: Left;    There were no vitals filed for this visit.      Subjective Assessment - 08/21/15 1505    Subjective  DOing better - look I can touch lower behind my neck - reach behind my back - red band is getting easier- and hand and wrist stretching it back still stiff little - but can make tight fist and    Patient Stated Goals I want to use my L arm again and hand  - like sewing, knitting, volunteer at animal rescue , play with grand kids , gardening    Currently in Pain? No/denies            [facility] OT Assessment - 08/21/15 0001    AROM   Left Forearm Pronation 90 Degrees   Left Wrist Extension 60 Degrees   Left Wrist Flexion 85 Degrees   Strength   Right Hand Grip (lbs) 55   Right Hand Lateral Pinch 16 lbs   Right Hand 3 Point Pinch 15 lbs   Left Hand Grip (lbs) 29   Left Hand Lateral Pinch 12 lbs   Left Hand 3 Point Pinch 11 lbs   Left Hand AROM   L Index  MCP 0-90 85 Degrees   L Index PIP 0-100 100 Degrees   L Long  MCP 0-90 90 Degrees   L Long PIP 0-100 100 Degrees   L Ring  MCP 0-90 90 Degrees   L Ring PIP 0-100 100 Degrees   L Little  MCP 0-90 90 Degrees      Measurements taken for AROM  Digits, wrist and elbow , grip and prehension strength  See flowsheet-  Flexion 145, extention -10 Review HEP with RTB - upgrade to green  For  Shoulder extention and retraction  10 reps  Elbow PROM extention - green band single  Against wall  Elbow flexion GTB - 2-3 positions  10 reps each  Upgrade HEP to green putty for grip, 3 point and lat grip  10 - 12 reps  But need to cont with scar massage and forearm flexors stretch  Pt to cont with HEP for 2 wks - will assess if progress with HEP                    OT Education - 08/21/15 1506    Education provided Yes   Education Details update HEP    Person(s) Educated Patient   Methods Explanation;Demonstration;Tactile cues;Verbal cues   Comprehension Verbal cues required;Returned demonstration;Verbalized understanding          OT Short Term Goals - 07/16/15 1939    OT SHORT TERM GOAL #2   Title L Wrist AROM in flexion , ext , RD and UD improve with WNL to return to prior level of function    Baseline ext impaired    Time 3   Period Weeks   Status New   OT SHORT TERM GOAL #3   Title Pain on PREE improve with 10 points    Baseline at  eval 13/50   Time 3    Period Weeks   Status New           OT Long Term Goals - 07/16/15 1940    OT LONG TERM GOAL #2   Title Grip strenght to be assess   Baseline CTS done 3 wks ago   Time 4   Period Weeks   Status New   OT LONG TERM GOAL #3   Title Elbow AROM in flexion and extention improve to [facility] to wash other armpit, fix hair, hold phone,    Baseline extention -24 and flexion 134   Time 6   Period Weeks   Status New   OT LONG TERM GOAL #4   Title Function on PREE improve by at least 12 points   Baseline Function score at eval 20/50   Time 6   Period Weeks   Status New               Plan - 08/21/15 1507    Clinical Impression Statement Pt making great progress in elbow flexion and extention -10 degrees - reinforce to stretch prior to doing theraband for end range - hand making fist great - still tight over scar and into wrist extention - but to cont with scar massage and upgrade to  firmer putty and Green thera ban - pt to cont with HEP for 2 wks and will reassess progress    Rehab Potential Good   OT Frequency Biweekly   OT Duration 2 weeks   OT Treatment/Interventions Self-care/ADL training;Moist Heat;Fluidtherapy;Parrafin;Therapeutic exercise;Patient/family education;Manual Therapy;DME and/or AE instruction;Passive range of motion;Scar mobilization   Plan assess progress with HEP for 2 wks   OT Home Exercise Plan see pt instruction    Consulted and Agree with Plan of Care Patient      Patient will benefit from skilled therapeutic intervention in order to improve the following deficits and impairments:  Decreased coordination, Decreased range of motion, Impaired flexibility, Pain, Impaired UE functional use, Decreased scar mobility, Decreased strength, Decreased knowledge of use of DME  Visit Diagnosis: Muscle weakness  Stiffness of elbow joint, left  Stiffness of left wrist, not elsewhere classified  Pain in left hand  Stiffness of hand joint,  left  Pain of left  forearm    Problem List Patient Active Problem List   Diagnosis Date Noted  . Olecranon fracture 02/18/2015    Oletta CohnuPreez, Deric Bocock OTR/L,CLT 08/21/2015, 6:07 PM  [facility] [address] [place], [place], [address] Phone: [phone]   Fax:  [phone]  Name: Rebecca Page MRN: [id number] Date of Birth: 08/14/1945

## 2015-08-31 ENCOUNTER — Ambulatory Visit: Payer: Medicare Other | Admitting: Physical Therapy

## 2015-08-31 ENCOUNTER — Encounter: Payer: Self-pay | Admitting: Physical Therapy

## 2015-08-31 DIAGNOSIS — R262 Difficulty in walking, not elsewhere classified: Secondary | ICD-10-CM

## 2015-08-31 DIAGNOSIS — M6281 Muscle weakness (generalized): Secondary | ICD-10-CM

## 2015-08-31 DIAGNOSIS — M25561 Pain in right knee: Secondary | ICD-10-CM

## 2015-09-01 NOTE — Therapy (Signed)
[facility]TS [facility]ICINE [address] [place], [address] Phone: [phone]   Fax:  [phone]  Physical Therapy Evaluation  Patient Details  Name: Rebecca Page MRN: [id number] Date of Birth: 1945-03-27 Referring Provider: Patience Musca PA  Encounter Date: 08/31/2015      PT End of Session - 08/31/15 1200    Visit Number 1   Number of Visits 8   Date for PT Re-Evaluation 09/29/15   Authorization Type 1   Authorization Time Period 10(G code)   PT Start Time 1022   PT Stop Time 1125   PT Time Calculation (min) 63 min   Activity Tolerance Patient tolerated treatment well   Behavior During Therapy [facility] for tasks assessed/performed      Past Medical History:  Diagnosis Date  . Anemia    HX of low platelets  . Arthritis   . Atrial fibrillation (HCC)   . Dysrhythmia   . Osteoporosis     Past Surgical History:  Procedure Laterality Date  . BREAST SURGERY Right    Breast Biopsy  . CARPAL TUNNEL RELEASE Left 06/25/2015   Procedure: CARPAL TUNNEL RELEASE;  Surgeon: Kennedy Bucker, MD;  Location: [facility] ORS;  Service: Orthopedics;  Laterality: Left;  . DILATION AND CURETTAGE OF UTERUS    . FRACTURE SURGERY    . HARDWARE REMOVAL Left 06/25/2015   Procedure: LEFT OLECRANON PLATE REMOVAL;  Surgeon: Kennedy Bucker, MD;  Location: [facility] ORS;  Service: Orthopedics;  Laterality: Left;  . LAPAROSCOPIC OOPHERECTOMY    . ORIF ELBOW FRACTURE Left 02/18/2015   Procedure: OPEN REDUCTION INTERNAL FIXATION (ORIF) ELBOW/OLECRANON FRACTURE;  Surgeon: Kennedy Bucker, MD;  Location: [facility] ORS;  Service: Orthopedics;  Laterality: Left;  . TONSILLECTOMY      There were no vitals filed for this visit.       Subjective Assessment - 08/31/15 1038    Subjective Patient reports she is having increased pain in her right knee since a cortisone shot in May 2017 (relief of pain in her knee lasted less than a week per patient report). Her  pain is located in front and sides of her knee. She is limited in ability to walk and go up/down steps leading with right LE.    Pertinent History January 10th patient reports she fell while in her home. 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 She has had quite a few falls in which she has had patella fracture right knee in the past, around 2011. Most recent fall she was diagnosed with fractures in her right knee she was placed in a splint and then a hinged brace; she progresses with weight bearing and reports having a lot of swelling.    Limitations House hold activities;Walking;Standing   Patient Stated Goals Patient would like to decrease pain in her right knee to be able to walk and perform daily tasks with less difficulty   Currently in Pain? 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  No   Is the patient reluctant to leave their home because of a fear of falling?  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   Leisure walking, keeps active, church activities     Cognition   Overall Cognitive Status Within Functional Limits for tasks assessed       Objective:  Gait: patient ambulating into clinic independently without assistive device, mild limp on right with decreased weight shift to right AROM: right knee WNL's for flexion/extension with noted increased crepitus PF joint with pain reported Strength: right LE hip ER and abduction 4/5, knee flexion 4/5, extension 4-/5 with (+) crepitus palpable throughout ROM and catching at inferior and lateral aspect of joint, as compared to left LE: WNL's Decreased VMO firing with decreased muscle mass right LE as compared to left LE Palpation: no point tenderness noted right knee/calf/thigh Special tests: Varus/Valgus stress (-) right knee Anterior/posterior drawer (-) right knee   Treatment: Therapeutic exercise: patient performed exercises with guidance, verbal and tactile cues and demonstration of PT: Sitting: SLR with quad set and hold 5 seconds x 10 reps Hip adduction with ball with glute sets x 10 reps Hip abduction with resistive band x 15 reps with hold 2-3 seconds  Knee flexion with resistive band x 15 reps Patella mobilization with distraction using patellar mobilizer x 3 reps, 10 second holds  Patient response to treatment: demonstrated good technique and understanding of home exercises following instruction, demonstration and with moderate verbal cuing, decreased crepitus of PF  joint with knee extension noted following distraction PF joint with patella mobilizer and modification of exercises with knee in slight flexion for SLR        PT Education - 08/31/15 1200    Education provided Yes   Education Details HEP: seated SLR, hip adduction with ball, abduction with resistive band, knee flexion with red resistive band   Person(s) Educated Patient   Methods Explanation;Demonstration;Verbal cues;Handout   Comprehension Verbalized understanding;Returned demonstration;Verbal cues required             PT Long Term Goals - 08/31/15 1245      PT LONG TERM GOAL #1   Title Patient will demonstrate improvement with functional use of right LE with decreased knee pain and  imrpoved abiltiy to negotiate stairs, perform household activies with LEFS score of 50/64 by 09/29/2015   Baseline LEFS 41/64   Status New     PT LONG TERM GOAL #2   Title Patient will be independent with home program for pain control right knee and progressive exercises for improving flexibiltiy and strength right LE to continue to improve once discharged from physical therapy by 09/29/2015   Baseline requires guidance and demonstration to perform exercises   Status New               Plan - 09/29/15 1200    Clinical Impression Statement Patient is a 70 year old female who presents with right knee pain which is worsening since fall January 2017 and limiting her ability to walk and perform daily tasks. She injured her knee when falling at home in January 2017. She currently has limitations of pain and weakness in right LE hip and knee musculature and has limited knowledge of appropriate exercises, pain control strategies and progression of exercises in order to return to full function. Her current impairment level is 40% based on LEFS score, pain and strength deficits. She is expected to achieve 20% or less impairment to allow her to go up/down steps and improve function for community and household  tasks following 4 weeks of skilled physical therapy intervention.    Rehab Potential Good   Clinical Impairments Affecting Rehab Potential (+)motivated, prior level of function (-)degenerative changes right knee per MRI, age   PT Frequency 2x / week   PT Duration 4 weeks   PT Treatment/Interventions Therapeutic exercise;Ultrasound;Moist Heat;Manual techniques;Taping;Neuromuscular re-education;Cryotherapy;Electrical Stimulation;Patient/family education;Iontophoresis /ml Dexamethasone   PT Next Visit Plan pain control, electrical stimulation for muscle re educaiton, progressive exercise   PT Home Exercise Plan hip and knee exercises for strengthening   Consulted and Agree with Plan of Care Patient      Patient will benefit from skilled therapeutic intervention in order to improve the following deficits and impairments:  Decreased strength, Decreased activity tolerance, Pain, Difficulty walking  Visit Diagnosis: Pain in right knee - Plan: PT plan of care cert/re-cert  Muscle weakness (generalized) - Plan: PT plan of care cert/re-cert  Difficulty in walking, not elsewhere classified - Plan: PT plan of care cert/re-cert      G-Codes - 09/29/15 1231    Functional Assessment Tool Used LEFS, strength, ROM, pain scale, clinical judgment   Functional Limitation Mobility: Walking and moving around   Mobility: Walking and Moving Around Current Status ([id number]) At least 40 percent but less than 60 percent impaired, limited or restricted   Mobility: Walking and Moving Around Goal Status [phone]) At least 1 percent but less than 20 percent impaired, limited or restricted       Problem List Patient Active Problem List   Diagnosis Date Noted  . Olecranon fracture 02/18/2015    Beacher May PT 09/01/2015, 1:58 PM  [facility] PHYSICAL AND SPORTS MEDICINE [address] [place], [address] Phone: [phone]   Fax:  [phone]  Name: Rebecca Page MRN: [id number] Date of Birth: Apr 07, 1945

## 2015-09-03 ENCOUNTER — Ambulatory Visit: Payer: Medicare Other | Admitting: Occupational Therapy

## 2015-09-03 DIAGNOSIS — M25642 Stiffness of left hand, not elsewhere classified: Secondary | ICD-10-CM

## 2015-09-03 DIAGNOSIS — M25622 Stiffness of left elbow, not elsewhere classified: Secondary | ICD-10-CM

## 2015-09-03 DIAGNOSIS — M79632 Pain in left forearm: Secondary | ICD-10-CM

## 2015-09-03 DIAGNOSIS — M6281 Muscle weakness (generalized): Secondary | ICD-10-CM

## 2015-09-03 DIAGNOSIS — M79642 Pain in left hand: Secondary | ICD-10-CM

## 2015-09-03 DIAGNOSIS — M25632 Stiffness of left wrist, not elsewhere classified: Secondary | ICD-10-CM

## 2015-09-03 NOTE — Therapy (Signed)
[facility]ORTS MEDICINE [address] [place], [address] Phone: [phone]   Fax:  [phone]  Occupational Therapy Treatment  Patient Details  Name: Rebecca Page MRN: [id number] Date of Birth: 16-Jul-1945 Referring Provider: Dr Rosita Kea  Encounter Date: 09/03/2015      OT End of Session - 09/03/15 1319    Visit Number 8   Number of Visits 12   Date for OT Re-Evaluation 09/24/15   OT Start Time 1300   Activity Tolerance Patient tolerated treatment well   Behavior During Therapy [facility] for tasks assessed/performed      Past Medical History:  Diagnosis Date  . Anemia    HX of low platelets  . Arthritis   . Atrial fibrillation (HCC)   . Dysrhythmia   . Osteoporosis     Past Surgical History:  Procedure Laterality Date  . BREAST SURGERY Right    Breast Biopsy  . CARPAL TUNNEL RELEASE Left 06/25/2015   Procedure: CARPAL TUNNEL RELEASE;  Surgeon: Kennedy Bucker, MD;  Location: [facility] ORS;  Service: Orthopedics;  Laterality: Left;  . DILATION AND CURETTAGE OF UTERUS    . FRACTURE SURGERY    . HARDWARE REMOVAL Left 06/25/2015   Procedure: LEFT OLECRANON PLATE REMOVAL;  Surgeon: Kennedy Bucker, MD;  Location: [facility] ORS;  Service: Orthopedics;  Laterality: Left;  . LAPAROSCOPIC OOPHERECTOMY    . ORIF ELBOW FRACTURE Left 02/18/2015   Procedure: OPEN REDUCTION INTERNAL FIXATION (ORIF) ELBOW/OLECRANON FRACTURE;  Surgeon: Kennedy Bucker, MD;  Location: [facility] ORS;  Service: Orthopedics;  Laterality: Left;  . TONSILLECTOMY      There were no vitals filed for this visit.      Subjective Assessment - 09/03/15 1316    Subjective  I went Gem stoning with my grandkids - and one it my elbow with the metal pan - it hurt so bad - could not move or use it - now started by stretches and yellow band - did that until I seen you  today    Patient Stated Goals I want to use my L arm again and hand - like sewing, knitting, volunteer at animal rescue  , play with grand kids , gardening    Currently in Pain? No/denies            [facility] OT Assessment - 09/03/15 0001      AROM   Left Wrist Extension 65 Degrees     Strength   Right Hand Grip (lbs) 55   Right Hand Lateral Pinch 16 lbs   Right Hand 3 Point Pinch 15 lbs   Left Hand Grip (lbs) 25   Left Hand Lateral Pinch 12 lbs   Left Hand 3 Point Pinch 11 lbs                  OT Treatments/Exercises (OP) - 09/03/15 0001      Moist Heat Therapy   Number Minutes Moist Heat 10 Minutes   Moist Heat Location Elbow      Measurements taken for AROM  At  wrist and elbow , grip and prehension strength  See flowsheet-  Flexion 150, extention -13  After heat Soft tissue mobs to palm and over CT scar to decrease tightness , scar tissue and increase ROM - decrease pain - Graston tool 2 and 4 - for sweeping , scooping and brushing  Over forearm volar and dorsal and upper arm - to increase elbow extention     Review HEP  For  Shoulder extention and retraction  10 reps  Elbow extention - Against wall  Elbow flexion  - 2-3 positions  10 reps each  Pt to cont with YTB - 15 reps not pain or then increase to red            OT Education - 09/03/15 1319    Education provided Yes   Education Details HEP changes   Person(s) Educated Patient   Methods Explanation;Demonstration;Tactile cues;Verbal cues   Comprehension Verbal cues required;Returned demonstration;Verbalized understanding          OT Short Term Goals - 07/16/15 1939      OT SHORT TERM GOAL #2   Title L Wrist AROM in flexion , ext , RD and UD improve with WNL to return to prior level of function    Baseline ext impaired    Time 3   Period Weeks   Status New     OT SHORT TERM GOAL #3   Title Pain on PREE improve with 10 points    Baseline at  eval 13/50   Time 3   Period Weeks   Status New           OT Long Term Goals - 07/16/15 1940      OT LONG TERM GOAL #2   Title Grip strenght to  be assess   Baseline CTS done 3 wks ago   Time 4   Period Weeks   Status New     OT LONG TERM GOAL #3   Title Elbow AROM in flexion and extention improve to [facility] to wash other armpit, fix hair, hold phone,    Baseline extention -24 and flexion 134   Time 6   Period Weeks   Status New     OT LONG TERM GOAL #4   Title Function on PREE improve by at least 12 points   Baseline Function score at eval 20/50   Time 6   Period Weeks   Status New               Plan - 09/03/15 1319    Rehab Potential Good   OT Frequency Biweekly   OT Duration 4 weeks   OT Treatment/Interventions Self-care/ADL training;Moist Heat;Fluidtherapy;Parrafin;Therapeutic exercise;Patient/family education;Manual Therapy;DME and/or AE instruction;Passive range of motion;Scar mobilization   Plan assess again in 3 wks - if progress and can be discharg   OT Home Exercise Plan see pt instruction    Consulted and Agree with Plan of Care Patient      Patient will benefit from skilled therapeutic intervention in order to improve the following deficits and impairments:  Decreased coordination, Decreased range of motion, Impaired flexibility, Pain, Impaired UE functional use, Decreased scar mobility, Decreased strength, Decreased knowledge of use of DME  Visit Diagnosis: Muscle weakness  Stiffness of elbow joint, left  Stiffness of left wrist, not elsewhere classified  Pain in left hand  Stiffness of hand joint, left  Pain of left forearm    Problem List Patient Active Problem List   Diagnosis Date Noted  . Olecranon fracture 02/18/2015    Oletta Cohn OTR/L,CLT  09/03/2015, 1:21 PM  [facility] PHYSICAL [facility] SPORTS MEDICINE [address] [place], [address] Phone: [phone]   Fax:  [phone]  Name: Rebecca Page MRN: [id number] Date of Birth: 1945-03-04

## 2015-09-03 NOTE — Patient Instructions (Addendum)
Work on PROM elbow extention Wrist extenion   scar massage and scar pads use- provided new [employer] car scar pad   Wear padding during kids activities on elbow  Gradually increase back to RTB and green band

## 2015-09-04 ENCOUNTER — Ambulatory Visit: Payer: Medicare Other | Admitting: Physical Therapy

## 2015-09-04 ENCOUNTER — Encounter: Payer: Self-pay | Admitting: Physical Therapy

## 2015-09-04 DIAGNOSIS — M6281 Muscle weakness (generalized): Secondary | ICD-10-CM

## 2015-09-04 DIAGNOSIS — M25561 Pain in right knee: Secondary | ICD-10-CM

## 2015-09-04 NOTE — Therapy (Signed)
[place] [facility] [address] [place], [address] Phone: [phone]   Fax:  [phone]  Physical Therapy Treatment  Patient Details  Name: Rebecca Page MRN: [id number] Date of Birth: 05-12-1945 Referring Provider: Patience Musca PA  Encounter Date: 09/04/2015      PT End of Session - 09/04/15 0815    Visit Number 2   Number of Visits 8   Date for PT Re-Evaluation 09/29/15   Authorization Type 2   Authorization Time Period 10(G code)   PT Start Time 0800   PT Stop Time 0855   PT Time Calculation (min) 55 min   Activity Tolerance Patient tolerated treatment well   Behavior During Therapy [facility] for tasks assessed/performed      Past Medical History:  Diagnosis Date  . Anemia    HX of low platelets  . Arthritis   . Atrial fibrillation (HCC)   . Dysrhythmia   . Osteoporosis     Past Surgical History:  Procedure Laterality Date  . BREAST SURGERY Right    Breast Biopsy  . CARPAL TUNNEL RELEASE Left 06/25/2015   Procedure: CARPAL TUNNEL RELEASE;  Surgeon: Kennedy Bucker, MD;  Location: [facility];  Service: Orthopedics;  Laterality: Left;  . DILATION AND CURETTAGE OF UTERUS    . FRACTURE SURGERY    . HARDWARE REMOVAL Left 06/25/2015   Procedure: LEFT OLECRANON PLATE REMOVAL;  Surgeon: Kennedy Bucker, MD;  Location: [facility];  Service: Orthopedics;  Laterality: Left;  . LAPAROSCOPIC OOPHERECTOMY    . ORIF ELBOW FRACTURE Left 02/18/2015   Procedure: OPEN REDUCTION INTERNAL FIXATION (ORIF) ELBOW/OLECRANON FRACTURE;  Surgeon: Kennedy Bucker, MD;  Location: [facility];  Service: Orthopedics;  Laterality: Left;  . TONSILLECTOMY      There were no vitals filed for this visit.      Subjective Assessment - 09/04/15 0812    Subjective Patient reports she is having less popping in her right knee and is performing exercises at home as instructed   Limitations House hold activities;Walking;Standing   Patient  Stated Goals Patient would like to decrease pain in her right knee to be able to walk and perform daily tasks with less difficulty   Currently in Pain? Yes   Pain Score 3    Pain Orientation Right   Pain Descriptors / Indicators Aching   Pain Type Chronic pain   Pain Onset More than a month ago   Pain Frequency Intermittent      Objective:  Observation: right knee with mild swelling  Quadriceps right LE: decreased VMO firing and quad control AROM: right knee WNL with pain in PF joint with full knee extension and with weight bearing with knee in full extension  Treatment:  Modalities: Electrical stimulation; [place] stimulation applied to right LE VMO/quadriceps muscle 10/10 cycle (intensity to contraction/tolerance) with patient in long sitting with LE supported; patient performed quad sets 2 x 5 second with each cycle; goal: neuromuscular re education, pain control  Therapeutic exercise:  patient performed exercises with guidance, verbal and tactile cues and demonstration of PT:  sitting:  Long sitting SLR with foot turned out hold 1-2": off table , hold x 5-10 seconds x 5 reps Hip adduction with ball with glute sets with hold 3 seconds x 15 reps Hip abduction  with resistive band (green) x 15 reps Standing:  Standing sway on airex balance mat forward and back x 2 min., side to side x 2 min.  Standing  walk on diagonal "wedding march" while holding (1) 3# weight in right hand (note: left arm unable to tolerate weight due to olecranon injury) Rotary hip machine for hip extension: standing on right LE with left hip extension 40# x 15 reps, standing on left LE with right hip extension 55# x 15 reps Patella mobilization with patella mobilizer to right LE PF joint x 3 reps: distraction with medial glide  Patient response to treatment: patient reported decreased knee pain to <1/10 and improved quadriceps contraction following Russian stim. treatment. She was able to walk and perform  exercises with knee slightly bent for all exercises to avoid increased PF joint pain. Technique and strength/motor conrol improved with repetitoin and verbal cuing for corect alignment of hip/knee during exercises        PT Education - 09/04/15 0813    Education provided Yes   Education Details HEP for right knee/LE: added diagonal wedding march, sway forward/back and side to side for weight shift; educated in estim. for muscle re education   Person(s) Educated Patient   Methods Explanation;Demonstration;Verbal cues   Comprehension Verbalized understanding;Returned demonstration;Verbal cues required             PT Long Term Goals - 08/31/15 1245      PT LONG TERM GOAL #1   Title Patient will demonstrate improvement with functional use of right LE with decreased knee pain and imrpoved abiltiy to negotiate stairs, perform household activies with LEFS score of 50/64 by 09/29/2015   Baseline LEFS 41/64   Status New     PT LONG TERM GOAL #2   Title Patient will be independent with home program for pain control right knee and progressive exercises for improving flexibiltiy and strength right LE to continue to improve once discharged from physical therapy by 09/29/2015   Baseline requires guidance and demonstration to perform exercises   Status New               Plan - 09/04/15 0815    Clinical Impression Statement Patient is progressing with exercises and did well with [place] stimulation with improved VMO firing and able to complete exercise with improved motor control. She continues with weakness and difficulty with walking due to right knee pain/weakness and will benefit from continued physical therapy intervention to address these limitaitons to improve function.    Rehab Potential Good   PT Frequency 2x / week   PT Duration 4 weeks   PT Treatment/Interventions Therapeutic exercise;Ultrasound;Moist Heat;Manual techniques;Taping;Neuromuscular  re-education;Cryotherapy;Electrical Stimulation;Patient/family education;Iontophoresis 4mg /ml Dexamethasone   PT Next Visit Plan pain control, electrical stimulation for muscle re educaiton, progressive exercise   PT Home Exercise Plan hip and knee exercises for strengthening      Patient will benefit from skilled therapeutic intervention in order to improve the following deficits and impairments:  Decreased strength, Decreased activity tolerance, Pain, Difficulty walking  Visit Diagnosis: Muscle weakness (generalized)  Pain in right knee     Problem List Patient Active Problem List   Diagnosis Date Noted  . Olecranon fracture 02/18/2015    Beacher May PT 09/04/2015, 9:56 AM  [place] [facility] SPORTS [facility]IC[facility] [address], [place], [address] Phone: [phone]   Fax:  [phone]  Name: Rebecca Page MRN: [id number] Date of Birth: [birth year]

## 2015-09-08 ENCOUNTER — Ambulatory Visit: Payer: Medicare Other | Attending: Orthopedic Surgery | Admitting: Physical Therapy

## 2015-09-08 ENCOUNTER — Encounter: Payer: Self-pay | Admitting: Physical Therapy

## 2015-09-08 DIAGNOSIS — M25622 Stiffness of left elbow, not elsewhere classified: Secondary | ICD-10-CM | POA: Diagnosis present

## 2015-09-08 DIAGNOSIS — M25632 Stiffness of left wrist, not elsewhere classified: Secondary | ICD-10-CM | POA: Diagnosis present

## 2015-09-08 DIAGNOSIS — M6281 Muscle weakness (generalized): Secondary | ICD-10-CM | POA: Insufficient documentation

## 2015-09-08 DIAGNOSIS — M79642 Pain in left hand: Secondary | ICD-10-CM | POA: Diagnosis present

## 2015-09-08 DIAGNOSIS — M25642 Stiffness of left hand, not elsewhere classified: Secondary | ICD-10-CM | POA: Diagnosis present

## 2015-09-08 DIAGNOSIS — M79632 Pain in left forearm: Secondary | ICD-10-CM | POA: Insufficient documentation

## 2015-09-08 DIAGNOSIS — M25561 Pain in right knee: Secondary | ICD-10-CM

## 2015-09-09 NOTE — Therapy (Signed)
[place] [facility] [address] [place], [address] Phone: [phone]   Fax:  [phone]  Physical Therapy Treatment  Patient Details  Name: Rebecca Page MRN: [id number] Date of Birth: 1945-12-02 Referring Provider: Patience Musca PA  Encounter Date: 09/08/2015      PT End of Session - 09/08/15 1626    Visit Number 3   Number of Visits 8   Date for PT Re-Evaluation 09/29/15   Authorization Type 3   Authorization Time Period 10(G code)   PT Start Time 1610   PT Stop Time 1700   PT Time Calculation (min) 50 min   Activity Tolerance Patient tolerated treatment well   Behavior During Therapy [facility] for tasks assessed/performed      Past Medical History:  Diagnosis Date  . Anemia    HX of low platelets  . Arthritis   . Atrial fibrillation (HCC)   . Dysrhythmia   . Osteoporosis     Past Surgical History:  Procedure Laterality Date  . BREAST SURGERY Right    Breast Biopsy  . CARPAL TUNNEL RELEASE Left 06/25/2015   Procedure: CARPAL TUNNEL RELEASE;  Surgeon: Kennedy Bucker, MD;  Location: [facility];  Service: Orthopedics;  Laterality: Left;  . DILATION AND CURETTAGE OF UTERUS    . FRACTURE SURGERY    . HARDWARE REMOVAL Left 06/25/2015   Procedure: LEFT OLECRANON PLATE REMOVAL;  Surgeon: Kennedy Bucker, MD;  Location: [facility];  Service: Orthopedics;  Laterality: Left;  . LAPAROSCOPIC OOPHERECTOMY    . ORIF ELBOW FRACTURE Left 02/18/2015   Procedure: OPEN REDUCTION INTERNAL FIXATION (ORIF) ELBOW/OLECRANON FRACTURE;  Surgeon: Kennedy Bucker, MD;  Location: [facility];  Service: Orthopedics;  Laterality: Left;  . TONSILLECTOMY      There were no vitals filed for this visit.      Subjective Assessment - 09/08/15 1624    Subjective Patient reports she is noticing improvement with less pain and grinding in right knee and improving strength and endurance. She reports she is moving to new home and [place] and is tired.    Limitations House hold activities;Walking;Standing   Patient Stated Goals Patient would like to decrease pain in her right knee to be able to walk and perform daily tasks with less difficulty   Currently in Pain? No/denies      Objective: Observation;  Right LE; improved muscle bulk noted right thigh/VMO/quadriceps as compared to previous session Gait: WNL's without deviations noted  Treatment: Modalities: Electrical stimulation; [place] stimulation applied to right LE VMO/quadriceps muscle 10/10 cycle (intensity to contraction/tolerance) with patient in long sitting with LE supported; patient performed quad sets 2 x 5 second with each cycle; goal: neuromuscular re education, pain control  Therapeutic exercise; patient performed exercises with guidance, verbal and tactile cues and demonstration of PT:  sitting:  Long sitting SLR with foot turned out hold 1-2": off table , hold x 10 seconds x 5 reps with 2# weight on thigh above knee then performed SLR x 15 reps without holding Hip adduction with ball with glute sets with hold 3 seconds x 15 reps Hip abduction  with resistive band (green) x 15 reps  Standing:  Standing walk on diagonal "wedding march" while holding (1) 3# weight in right hand (note: left arm unable to tolerate weight due to olecranon injury) Instructed in standing hip abduction and extension with resistive band around thighs 3 x  5 reps x 3 sets each LE Standing knee TKE with resistive band x 30 reps  Patella mobilization with patella mobilizer to right LE PF joint x 3 reps: distraction with medial glide  Patient response to treatment:  Patient reported decreased knee pain today and no pain with exercises. Fatigue noted with SLR after 10 reps; improved quadriceps contraction/control following estim.(note required increased intensity to achieve contraction due to fatigue) good demonstration of exercises  with minimal cuing today          PT Education - 09/08/15 1625    Education provided Yes   Education Details Educated in resting when doing extra standing and moving such as with current situation with moving to new home: packing, walking, driving more   Person(s) Educated Patient   Methods Explanation   Comprehension Verbalized understanding             PT Long Term Goals - 08/31/15 1245      PT LONG TERM GOAL #1   Title Patient will demonstrate improvement with functional use of right LE with decreased knee pain and imrpoved abiltiy to negotiate stairs, perform household activies with LEFS score of 50/64 by 09/29/2015   Baseline LEFS 41/64   Status New     PT LONG TERM GOAL #2   Title Patient will be independent with home program for pain control right knee and progressive exercises for improving flexibiltiy and strength right LE to continue to improve once discharged from physical therapy by 09/29/2015   Baseline requires guidance and demonstration to perform exercises   Status New               Plan - 09/08/15 1626    Clinical Impression Statement Patient is progresing with decreased pain and improving strength in right LE with current treatment. Limited exercises to mild/moderate intensity due to patient being tired with moving to new home and being up on feet quite a bit. She continues with limitations of strength and endurance and requires guidance/cuing to complete exercises and will therefore benefit from additional physical therapy intervention to achieve goals.    Rehab Potential Good   PT Duration 4 weeks   PT Treatment/Interventions Therapeutic exercise;Ultrasound;Moist Heat;Manual techniques;Taping;Neuromuscular re-education;Cryotherapy;Electrical Stimulation;Patient/family education;Iontophoresis 4mg /ml Dexamethasone   PT Next Visit Plan pain control, electrical stimulation for muscle re educaiton, progressive exercise   PT Home Exercise Plan hip and knee  exercises for strengthening      Patient will benefit from skilled therapeutic intervention in order to improve the following deficits and impairments:  Decreased strength, Decreased activity tolerance, Pain, Difficulty walking  Visit Diagnosis: Muscle weakness (generalized)  Pain in right knee     Problem List Patient Active Problem List   Diagnosis Date Noted  . Olecranon fracture 02/18/2015    Beacher May PT 09/09/2015, 3:30 PM  [place] [facility] [address] [place], [address] Phone: [phone]   Fax:  [phone]  Name: Rebecca Page MRN: [id number] Date of Birth: 05/01/1945

## 2015-09-10 ENCOUNTER — Ambulatory Visit: Payer: Medicare Other | Admitting: Physical Therapy

## 2015-09-10 DIAGNOSIS — M25561 Pain in right knee: Secondary | ICD-10-CM

## 2015-09-10 DIAGNOSIS — M6281 Muscle weakness (generalized): Secondary | ICD-10-CM

## 2015-09-10 NOTE — Therapy (Signed)
[facility] [address] [place], [address] Phone: [phone]   Fax:  [phone]  Physical Therapy Treatment  Patient Details  Name: Rebecca Page MRN: [id number] Date of Birth: [birth year] Referring Provider: Patience Musca PA  Encounter Date: 09/10/2015      PT End of Session - 09/10/15 0905    Visit Number 4   Number of Visits 8   Date for PT Re-Evaluation 09/29/15   Authorization Type 4   Authorization Time Period 10(G code)   PT Start Time 0816   PT Stop Time 0903   PT Time Calculation (min) 47 min   Activity Tolerance Patient tolerated treatment well   Behavior During Therapy [facility] for tasks assessed/performed      Past Medical History:  Diagnosis Date  . Anemia    HX of low platelets  . Arthritis   . Atrial fibrillation (HCC)   . Dysrhythmia   . Osteoporosis     Past Surgical History:  Procedure Laterality Date  . BREAST SURGERY Right    Breast Biopsy  . CARPAL TUNNEL RELEASE Left 06/25/2015   Procedure: CARPAL TUNNEL RELEASE;  Surgeon: Kennedy Bucker, MD;  Location: [facility];  Service: Orthopedics;  Laterality: Left;  . DILATION AND CURETTAGE OF UTERUS    . FRACTURE SURGERY    . HARDWARE REMOVAL Left 06/25/2015   Procedure: LEFT OLECRANON PLATE REMOVAL;  Surgeon: Kennedy Bucker, MD;  Location: [facility];  Service: Orthopedics;  Laterality: Left;  . LAPAROSCOPIC OOPHERECTOMY    . ORIF ELBOW FRACTURE Left 02/18/2015   Procedure: OPEN REDUCTION INTERNAL FIXATION (ORIF) ELBOW/OLECRANON FRACTURE;  Surgeon: Kennedy Bucker, MD;  Location: [facility];  Service: Orthopedics;  Laterality: Left;  . TONSILLECTOMY      There were no vitals filed for this visit.      Subjective Assessment - 09/10/15 0829    Subjective Patient is currently moving to a new home and she has been working for the past few days driving back and forth and is fatigued today.    Limitations House hold  activities;Walking;Standing   Patient Stated Goals Patient would like to decrease pain in her right knee to be able to walk and perform daily tasks with less difficulty   Currently in Pain? No/denies         Objective: Observation;  Gait: WNL's without deviations noted Right LE; improved muscle bulk noted right VMO/quadriceps as compared to previous session  Treatment: Modalities: Electrical stimulation; [place] stimulation applied to right VMO/quadriceps muscle 10/10 cycle (intensity to contraction/tolerance) with patient in long sitting with LE supported; patient performed quad sets 2 x 5 second with each cycle; goal: neuromuscular re education, pain control  Therapeutic exercise; patient performed exercises with guidance, verbal and tactile cues and demonstration of PT: sitting:  Long sitting SLR with foot turned out hold 1-2": off table  2# weight on thigh above knee SLR x 15 reps without holding Hip adduction with ball with glute sets with hold 3 seconds x 15 reps Hip abduction with resistive band (green) x 15 reps Side lying: Clamshells with 2# weight above right knee with cuing for correct alignment Prone lying: Hip extension with 2# above thigh x 10 Knee flexion x 10 2# at ankle Standing:  Standing walk on diagonal "wedding march" while holding (1) 3# weight in right hand (note: left arm unable to tolerate weight due to olecranon injury) Standing R knee TKE with resistive band x 30 reps  Standing hip rotary machine 40# hip extension x 15 reps each LE with cuing to perform with proper hip/trunk alignment  Patella mobilization with patella mobilizer to right LE PF joint x 3 reps: distraction with medial glide  Patient response to treatment: Patient with fatigue today limiting exercises performed. Improved quadriceps control following electrical stimulation and demonstrated good technique with exercises with minimal cuing   Patient education: instructed in home exercise  program for TKE with resistive band, re assessed HEP Demonstrated, verbal cuing with patient returning demonstration and verbalized understanding       PT Long Term Goals - 08/31/15 1245      PT LONG TERM GOAL #1   Title Patient will demonstrate improvement with functional use of right LE with decreased knee pain and imrpoved abiltiy to negotiate stairs, perform household activies with LEFS score of 50/64 by 09/29/2015   Baseline LEFS 41/64   Status New     PT LONG TERM GOAL #2   Title Patient will be independent with home program for pain control right knee and progressive exercises for improving flexibiltiy and strength right LE to continue to improve once discharged from physical therapy by 09/29/2015   Baseline requires guidance and demonstration to perform exercises   Status New               Plan - 09/10/15 0904    Clinical Impression Statement Patient demonstrates good carry over between visits with improved strength and function with daily activties with decreased pain and grinding in right knee. She continues with limited knowledge of exercises to continue for self management and will therefore benefit from additional physical therapy intervention to achieve goals.    Rehab Potential Good   PT Frequency 2x / week   PT Duration 4 weeks   PT Treatment/Interventions Therapeutic exercise;Ultrasound;Moist Heat;Manual techniques;Taping;Neuromuscular re-education;Cryotherapy;Electrical Stimulation;Patient/family education;Iontophoresis /ml Dexamethasone   PT Next Visit Plan pain control, electrical stimulation for muscle re educaiton, progressive exercise   PT Home Exercise Plan hip and knee exercises for strengthening      Patient will benefit from skilled therapeutic intervention in order to improve the following deficits and impairments:  Decreased strength, Decreased activity tolerance, Pain, Difficulty walking  Visit Diagnosis: Muscle weakness (generalized)  Pain in  right knee     Problem List Patient Active Problem List   Diagnosis Date Noted  . Olecranon fracture 02/18/2015    Beacher May PT 09/10/2015, 10:38 PM  [place] [facility] PH[facility] AND SPORTS MEDICINE [address] [place], [address] Phone: [phone]   Fax:  [phone]  Name: Yarieliz Wasser MRN: [id number] Date of Birth: 08-03-1945

## 2015-09-22 ENCOUNTER — Ambulatory Visit: Payer: Medicare Other | Admitting: Occupational Therapy

## 2015-09-22 ENCOUNTER — Ambulatory Visit: Payer: Medicare Other | Admitting: Physical Therapy

## 2015-09-24 ENCOUNTER — Encounter: Payer: Self-pay | Admitting: Physical Therapy

## 2015-09-24 ENCOUNTER — Ambulatory Visit: Payer: Medicare Other | Admitting: Physical Therapy

## 2015-09-24 DIAGNOSIS — M6281 Muscle weakness (generalized): Secondary | ICD-10-CM

## 2015-09-24 DIAGNOSIS — M25561 Pain in right knee: Secondary | ICD-10-CM

## 2015-09-24 NOTE — Therapy (Signed)
[facility] [address] [place], [place], [address] Phone: [phone]   Fax:  [phone]  Physical Therapy Treatment  Patient Details  Name: Rebecca DoomGloria Bowery MRN: [id number] Date of Birth: 01/01/1946 Referring Provider: Patience MuscaGaines, Thomas Christopher PA  Encounter Date: 09/24/2015      PT End of Session - 09/24/15 1755    Visit Number 5   Number of Visits 8   Date for PT Re-Evaluation 09/29/15   Authorization Type 5   Authorization Time Period 10(G code)   PT Start Time 1708   PT Stop Time 1745   PT Time Calculation (min) 37 min   Activity Tolerance Patient tolerated treatment well;Patient limited by pain   Behavior [facility] for tasks assessed/performed      Past Medical History:  Diagnosis Date  . Anemia    HX of low platelets  . Arthritis   . Atrial fibrillation (HCC)   . Dysrhythmia   . Osteoporosis     Past Surgical History:  Procedure Laterality Date  . BREAST SURGERY Right    Breast Biopsy  . CARPAL TUNNEL RELEASE Left 06/25/2015   Procedure: CARPAL TUNNEL RELEASE;  Surgeon: Kennedy BuckerMichael Menz, MD;  Location: [facility] ORS;  Service: Orthopedics;  Laterality: Left;  . DILATION AND CURETTAGE OF UTERUS    . FRACTURE SURGERY    . HARDWARE REMOVAL Left 06/25/2015   Procedure: LEFT OLECRANON PLATE REMOVAL;  Surgeon: Kennedy BuckerMichael Menz, MD;  Location: [facility] ORS;  Service: Orthopedics;  Laterality: Left;  . LAPAROSCOPIC OOPHERECTOMY    . ORIF ELBOW FRACTURE Left 02/18/2015   Procedure: OPEN REDUCTION INTERNAL FIXATION (ORIF) ELBOW/OLECRANON FRACTURE;  Surgeon: Kennedy BuckerMichael Menz, MD;  Location: [facility] ORS;  Service: Orthopedics;  Laterality: Left;  . TONSILLECTOMY      There were no vitals filed for this visit.      Subjective Assessment - 09/24/15 1731    Subjective Patient reports increased pain and grinding in right knee today. She has been moving and reports she is tired today.    Limitations House hold  activities;Walking;Standing   Patient Stated Goals Patient would like to decrease pain in her right knee to be able to walk and perform daily tasks with less difficulty   Currently in Pain? Yes   Pain Score 3    Pain Location Knee   Pain Orientation Right   Pain Descriptors / Indicators Aching   Pain Type Chronic pain   Pain Onset More than a month ago   Pain Frequency Intermittent     Objective: Observation;  Gait: WNL's without deviations noted Right LE; improved muscle bulk noted right VMO/quadriceps as compared to previous session Palpation; + significant popping PF joint with knee flexion/extension at distinct ROM degrees, differing each repetition  Treatment: Modalities: Electrical stimulation; [place] stimulation x 20 min. applied to right VMO/quadriceps muscle 10/10 cycle (intensity to contraction/tolerance) with patient in long sitting with LE supported; patient performed quad sets 2 x 5 second with each cycle; goal: neuromuscular re education, pain control  Therapeutic exercise; patient performed exercises with guidance, verbal and tactile cues and demonstration of PT: sitting:  Long sitting SLR with foot turned out hold 1-2": off table without weight x 5 reps Hip adduction with ball with glute sets with hold 3 seconds x 15 reps Standing:  Standing walk on diagonal "wedding march" while holding (1) 3# weight in right hand (note: left arm unable to tolerate weight due to olecranon injury) x 2 min.  Side stepping on airex balance beam x 2 min. Standing hip rotary machine 40# hip abduction x 15 reps each LE with cuing to perform with proper hip/trunk alignment  Patella mobilization with patella mobilizer to right LE PF joint x 3 reps: distraction with medial glide  Patient response to treatment: Patient with fatigue and increased grinding/crepitus in PF joint today limiting exercises performed. Improved quadriceps control and ability to perform exercises with  modifcationsfollowing electrical stimulation; demonstrated good technique with exercises with minimal cuing        PT Education - 09/24/15 1755    Education provided Yes   Education Details HEP: modified exercises to avoid knee grinding and pain: keep knee in extension with knee unlocked   Person(s) Educated Patient   Methods Explanation;Demonstration;Verbal cues   Comprehension Verbalized understanding;Returned demonstration;Verbal cues required             PT Long Term Goals - 08/31/15 1245      PT LONG TERM GOAL #1   Title Patient will demonstrate improvement with functional use of right LE with decreased knee pain and imrpoved abiltiy to negotiate stairs, perform household activies with LEFS score of 50/64 by 09/29/2015   Baseline LEFS 41/64   Status New     PT LONG TERM GOAL #2   Title Patient will be independent with home program for pain control right knee and progressive exercises for improving flexibiltiy and strength right LE to continue to improve once discharged from physical therapy by 09/29/2015   Baseline requires guidance and demonstration to perform exercises   Status New               Plan - 09/24/15 1755    Clinical Impression Statement Patient is progressing with knowledge of exercises and modificaitons in order to no aggravate pain in knee. She was able to perform all exercise with guidance and modifications with results of decreased popping in PF joint at end of session. She will benefit from additional physical therapy intervention to achieve goals.    Rehab Potential Good   PT Frequency 2x / week   PT Duration 4 weeks   PT Treatment/Interventions Therapeutic exercise;Ultrasound;Moist Heat;Manual techniques;Taping;Neuromuscular re-education;Cryotherapy;Electrical Stimulation;Patient/family education;Iontophoresis 4mg /ml Dexamethasone   PT Next Visit Plan pain control, electrical stimulation for muscle re educaiton, progressive exercise   PT Home  Exercise Plan hip and knee exercises for strengthening      Patient will benefit from skilled therapeutic intervention in order to improve the following deficits and impairments:  Decreased strength, Decreased activity tolerance, Pain, Difficulty walking  Visit Diagnosis: Muscle weakness (generalized)  Pain in right knee     Problem List Patient Active Problem List   Diagnosis Date Noted  . Olecranon fracture 02/18/2015    Beacher MayBrooks, Solara Goodchild PT 09/25/2015, 2:35 PM  [facility] [address] [place], [place], [address] Phone: [phone]   Fax:  [phone]  Name: Rebecca DoomGloria Danese MRN: [id number] Date of Birth: 04/20/1945

## 2015-10-02 ENCOUNTER — Ambulatory Visit: Payer: Medicare Other | Admitting: Occupational Therapy

## 2015-10-02 DIAGNOSIS — M6281 Muscle weakness (generalized): Secondary | ICD-10-CM

## 2015-10-02 DIAGNOSIS — M25642 Stiffness of left hand, not elsewhere classified: Secondary | ICD-10-CM

## 2015-10-02 DIAGNOSIS — M25622 Stiffness of left elbow, not elsewhere classified: Secondary | ICD-10-CM

## 2015-10-02 DIAGNOSIS — M79642 Pain in left hand: Secondary | ICD-10-CM

## 2015-10-02 DIAGNOSIS — M25632 Stiffness of left wrist, not elsewhere classified: Secondary | ICD-10-CM

## 2015-10-02 DIAGNOSIS — M79632 Pain in left forearm: Secondary | ICD-10-CM

## 2015-10-02 NOTE — Patient Instructions (Signed)
Pt to cont with just using it - if pain more than 3/10 and sharp or shooting - hold off and try later again

## 2015-10-02 NOTE — Therapy (Signed)
[place] [facility] [address] [place], [address] Phone: [phone]   Fax:  [phone]  Occupational Therapy Treatment  Patient Details  Name: Rebecca Page MRN: [id number] Date of Birth: 1946/01/15 Referring Provider: Dr Rudene Christians  Encounter Date: 10/02/2015      OT End of Session - 10/02/15 1358    Visit Number 9   Number of Visits 9   Date for OT Re-Evaluation 10/02/15   OT Start Time 1300   OT Stop Time 1324   OT Time Calculation (min) 24 min   Activity Tolerance Patient tolerated treatment well   Behavior During Therapy [facility] for tasks assessed/performed      Past Medical History:  Diagnosis Date  . Anemia    HX of low platelets  . Arthritis   . Atrial fibrillation ([place])   . Dysrhythmia   . Osteoporosis     Past Surgical History:  Procedure Laterality Date  . BREAST SURGERY Right    Breast Biopsy  . CARPAL TUNNEL RELEASE Left 06/25/2015   Procedure: CARPAL TUNNEL RELEASE;  Surgeon: Hessie Knows, MD;  Location: [facility] ORS;  Service: Orthopedics;  Laterality: Left;  . DILATION AND CURETTAGE OF UTERUS    . FRACTURE SURGERY    . HARDWARE REMOVAL Left 06/25/2015   Procedure: LEFT OLECRANON PLATE REMOVAL;  Surgeon: Hessie Knows, MD;  Location: [facility];  Service: Orthopedics;  Laterality: Left;  . LAPAROSCOPIC OOPHERECTOMY    . ORIF ELBOW FRACTURE Left 02/18/2015   Procedure: OPEN REDUCTION INTERNAL FIXATION (ORIF) ELBOW/OLECRANON FRACTURE;  Surgeon: Hessie Knows, MD;  Location: [facility];  Service: Orthopedics;  Laterality: Left;  . TONSILLECTOMY      There were no vitals filed for this visit.      Subjective Assessment - 10/02/15 1356    Subjective  I am doing better - was using it a lot - [place] - but some pain in the elbow at times if used it a lot - and stiffness - but then my L side was always my weaker side - my sister reminded me about that the other day    Patient Stated Goals I want to use my L arm again and hand - like sewing, knitting, volunteer at animal rescue , play with grand kids , gardening    Currently in Pain? No/denies            [facility] OT Assessment - 10/02/15 0001      Strength   Right Hand Grip (lbs) (P)  60   Right Hand Lateral Pinch (P)  18 lbs   Right Hand 3 Point Pinch (P)  16 lbs   Left Hand Grip (lbs) (P)  36   Left Hand Lateral Pinch (P)  14 lbs     measurements taken - M/M in L UE 5/5 shoulder , elbow and wrist  Grip and prehension assess - see flowsheet  PREE discuss and simulated - see score on recert Discharge instructions  And findings of assessment discuss                      OT Education - 10/02/15 1358    Education provided Yes   Education Details discharge instructions    Person(s) Educated Patient   Methods Demonstration;Tactile cues;Verbal cues;Explanation   Comprehension Verbal cues required;Returned demonstration;Verbalized understanding          OT Short Term Goals - 10/02/15 1359  OT SHORT TERM GOAL #1   Title Pt able to make full flexion and extention to hold sleeve and put hand in pocket    Status Achieved     OT SHORT TERM GOAL #2   Title L Wrist AROM in flexion , ext , RD and UD improve with WNL to return to prior level of function    Status Achieved     OT SHORT TERM GOAL #3   Title Pain on PREE improve with 10 points    Status Achieved           OT Long Term Goals - 10/02/15 1400      OT LONG TERM GOAL #1   Title shoulder  AROM for flexion and extention improve to WNL  to reach above head    Status Achieved     OT LONG TERM GOAL #2   Title grip strength at least 50% compare to R    Baseline R 65 and L 35   Status Achieved     OT LONG TERM GOAL #3   Title Elbow AROM in flexion and extention improve to [facility] to wash other armpit, fix hair, hold phone,    Status Achieved     OT LONG TERM GOAL #4   Title Function on PREE improve by at least  12 points   Status Achieved               Plan - 10/02/15 1402    Clinical Impression Statement Pt made great progress from [facility] in ROM , strength , pain and fucntional use - pt return this date after seen month ago- pt met all goals - and was last week 3 months out from hardwear removal - pt  to cont to use it - do expect to have some stiffness at times in hand and elbow - and pain less than 2/10 - but should improve over time - cont to use  bilateral hands and UE - and phone if any issues - but discharge at this time    OT Treatment/Interventions Self-care/ADL training;Moist Heat;Fluidtherapy;Parrafin;Therapeutic exercise;Patient/family education;Manual Therapy;DME and/or AE instruction;Passive range of motion;Scar mobilization   Consulted and Agree with Plan of Care Patient      Patient will benefit from skilled therapeutic intervention in order to improve the following deficits and impairments:     Visit Diagnosis: Muscle weakness (generalized) - Plan: Ot plan of care cert/re-cert  Stiffness of elbow joint, left - Plan: Ot plan of care cert/re-cert  Stiffness of left wrist, not elsewhere classified - Plan: Ot plan of care cert/re-cert  Pain in left hand - Plan: Ot plan of care cert/re-cert  Stiffness of hand joint, left - Plan: Ot plan of care cert/re-cert  Pain of left forearm - Plan: Ot plan of care cert/re-cert    Problem List Patient Active Problem List   Diagnosis Date Noted  . Olecranon fracture 02/18/2015    Rosalyn Gess OTR/L,CLT 10/02/2015, 2:07 PM  [place] [facility] [address], [place], [address] Phone: [phone]   Fax:  [phone]  Name: Rebecca Page MRN: [id number] Date of Birth: [birth year]

## 2015-10-08 ENCOUNTER — Ambulatory Visit: Payer: Medicare Other | Admitting: Physical Therapy

## 2015-10-08 ENCOUNTER — Encounter: Payer: Self-pay | Admitting: Physical Therapy

## 2015-10-08 DIAGNOSIS — M25561 Pain in right knee: Secondary | ICD-10-CM

## 2015-10-08 DIAGNOSIS — M6281 Muscle weakness (generalized): Secondary | ICD-10-CM

## 2015-10-08 NOTE — Therapy (Signed)
[facility] [address], [place], [address] Phone: [phone]   Fax:  [phone]  Physical Therapy Treatment  Patient Details  Name: Rebecca Page MRN: [id number] Date of Birth: [birth year] Referring Provider: Patience Musca PA  Encounter Date: 10/08/2015      PT End of Session - 10/08/15 1300    Visit Number 6   Number of Visits 12   Date for PT Re-Evaluation 12/03/15   Authorization Type 6   Authorization Time Period 10(G code)   PT Start Time 1150   PT Stop Time 1245   PT Time Calculation (min) 55 min   Activity Tolerance Patient tolerated treatment well;Patient limited by pain   Behavior During Therapy [facility] for tasks assessed/performed      Past Medical History:  Diagnosis Date  . Anemia    HX of low platelets  . Arthritis   . Atrial fibrillation (HCC)   . Dysrhythmia   . Osteoporosis     Past Surgical History:  Procedure Laterality Date  . BREAST SURGERY Right    Breast Biopsy  . CARPAL TUNNEL RELEASE Left 06/25/2015   Procedure: CARPAL TUNNEL RELEASE;  Surgeon: Kennedy Bucker, MD;  Location: [facility] ORS;  Service: Orthopedics;  Laterality: Left;  . DILATION AND CURETTAGE OF UTERUS    . FRACTURE SURGERY    . HARDWARE REMOVAL Left 06/25/2015   Procedure: LEFT OLECRANON PLATE REMOVAL;  Surgeon: Kennedy Bucker, MD;  Location: [facility] ORS;  Service: Orthopedics;  Laterality: Left;  . LAPAROSCOPIC OOPHERECTOMY    . ORIF ELBOW FRACTURE Left 02/18/2015   Procedure: OPEN REDUCTION INTERNAL FIXATION (ORIF) ELBOW/OLECRANON FRACTURE;  Surgeon: Kennedy Bucker, MD;  Location: [facility];  Service: Orthopedics;  Laterality: Left;  . TONSILLECTOMY      There were no vitals filed for this visit.      Subjective Assessment - 10/08/15 1151    Subjective Patient reports her left and right knee are hurting today and she is having more difficulty with going up/down stairs. 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 She would like to continue with therapy a few more sessions to be guided for transition to independent with home program.    Limitations House hold activities;Walking;Standing   Patient Stated Goals Patient would like to decrease pain in her right knee to be able to walk and perform daily tasks with less difficulty   Currently in Pain? Yes   Pain Score 4    Pain Location Knee   Pain Orientation Right   Pain Descriptors / Indicators Aching   Pain Type Chronic pain   Pain Onset More than a month ago   Pain Frequency Intermittent      Objective: Observation;  Gait: WNL's without deviations noted Right LE; improved muscle bulk noted right VMO/quadriceps as compared to previous session Palpation; + significant popping PF joint with knee flexion/extension at distinct ROM degrees, differing each repetition  Treatment: Modalities: Electrical stimulation; [place] stimulation x 20 min. applied to right VMO/quadriceps muscle 10/10 cycle (intensity to contraction/tolerance) with patient in long sitting with LE supported; patient performed quad sets 2 x 5 second with each cycle; goal: neuromuscular re education, pain control  Therapeutic exercise; patient performed exercises with guidance, verbal and tactile cues and demonstration of PT: sitting:  Hip adduction with ball with glute sets with hold 3 seconds x 15 reps Short arc quadriceps 90 - 40 and TKE monitoring popping in PF joint  throughout Instructed to work on SLR primarily when having increased popping in joint Standing:  Resistive band walking forward, backwards, side stepping with black resistive band x 5 steps each  Patella mobilization with patella mobilizer to right LE PF joint x 3 reps: distraction with medial glide  Patient response to treatment: Patient demonstrated good understanding of home program with modifications to avoid increased popping in right PF joint. improved quad control, muscle contraction  following electrical stimulation treatment.        PT Education - 10/08/15 1209    Education provided Yes   Education Details HEP; walking up/down stairs with change in posture to alleviate pain and use of knee support to help with knee discomfort; resistive band walking forward, backwards, side stepping, knee extension within appropriate ROM to avoid popping   Person(s) Educated Patient   Methods Explanation;Demonstration;Verbal cues   Comprehension Verbalized understanding;Returned demonstration             PT Long Term Goals - 10/08/15 1300      PT LONG TERM GOAL #1   Title Patient will demonstrate improvement with functional use of right LE with decreased knee pain and imrpoved abiltiy to negotiate stairs, perform household activies with LEFS score of 50/64 by 12/03/2015   Baseline LEFS 41/64 initial:  (10/08/15: 40/64)   Status Revised     PT LONG TERM GOAL #2   Title Patient will be independent with home program for pain control right knee and progressive exercises for improving flexibiltiy and strength right LE to continue to improve once discharged from physical therapy by 12/01/2015   Baseline requires guidance and demonstration to perform exercises and to modify exercises appropriately   Status Revised               Plan - 10/08/15 1300    Clinical Impression Statement Patient is progressing with improving strength right LE and learning to modify exercises to avoid popping and pain in right PF joint. She continues with weakness in right hip/knee musculature that limits full functional use of right LE for walking, climbing stairs.    Rehab Potential Good   PT Frequency 1x / week   PT Duration Other (comment)  every 1-2 weeks   PT Treatment/Interventions Therapeutic exercise;Ultrasound;Moist Heat;Manual techniques;Taping;Neuromuscular re-education;Cryotherapy;Electrical Stimulation;Patient/family education;Iontophoresis 4mg /ml Dexamethasone   PT Next Visit Plan  pain control, electrical stimulation for muscle re educaiton, progressive exercise   PT Home Exercise Plan hip and knee exercises for strengthening with modifications to avoid increase popping/pain in PF joint   Consulted and Agree with Plan of Care Patient      Patient will benefit from skilled therapeutic intervention in order to improve the following deficits and impairments:  Decreased strength, Decreased activity tolerance, Pain, Difficulty walking  Visit Diagnosis: Muscle weakness (generalized) - Plan: PT plan of care cert/re-cert  Pain in right knee - Plan: PT plan of care cert/re-cert     Problem List Patient Active Problem List   Diagnosis Date Noted  . Olecranon fracture 02/18/2015    Beacher MayBrooks, Alonda Weaber PT 10/08/2015, 10:26 PM  [facility] [address] [place], [place], [address] Phone: [phone]   Fax:  [phone]  Name: Rebecca Page MRN: [id number] Date of Birth: 05/03/1945

## 2015-10-27 ENCOUNTER — Encounter: Payer: Medicare Other | Admitting: Physical Therapy

## 2018-04-01 IMAGING — MR MR KNEE*R* W/O CM
6 series · 40 of 40 positions shown · non-contrast
Comparison: Radiographs 02/17/2015.

CLINICAL DATA: Right knee pain and swelling, worsening with
weight-bearing. History of fall with patellar fracture 5 months ago.
No subsequent surgery.

EXAM:
MRI OF THE RIGHT KNEE WITHOUT CONTRAST
TECHNIQUE: Multiplanar, multisequence MR imaging of the knee was performed. No
intravenous contrast was administered.

[Series 3: PD fat-sat · axial · 3.0mm · 0.29mm/px · z∈[-64,+49]mm · 9 of 35 slices shown (1 of 3)]
[im 1/35]
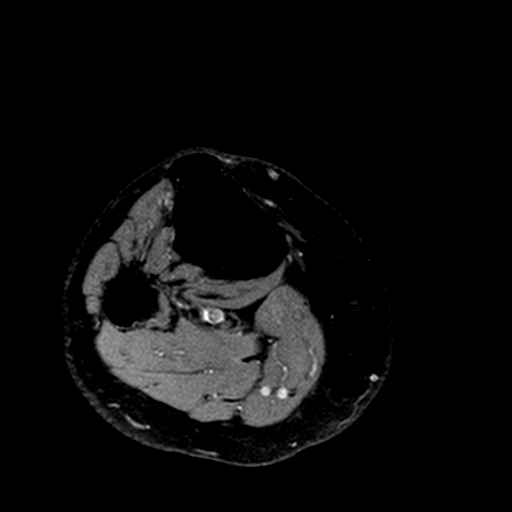
[im 5/35]
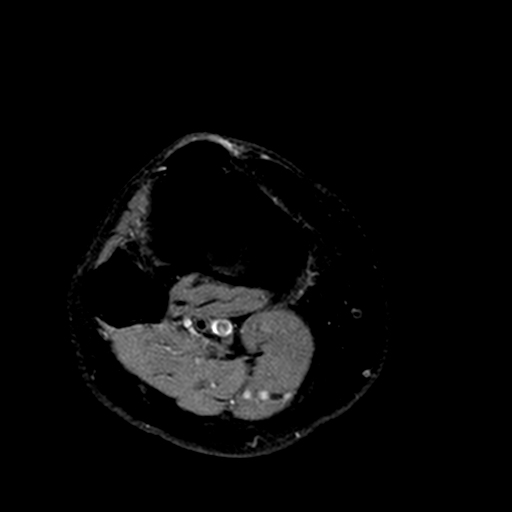
[im 9/35]
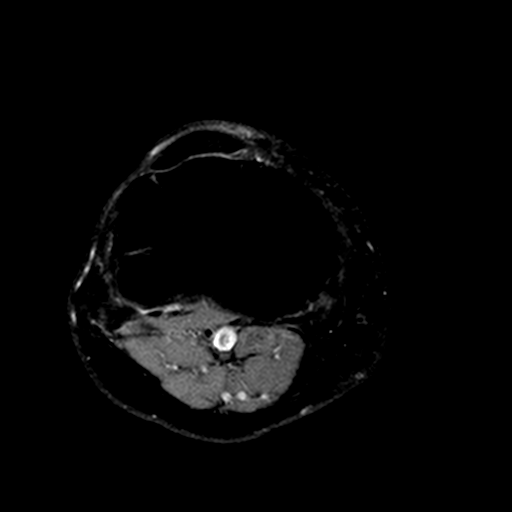
[im 13/35]
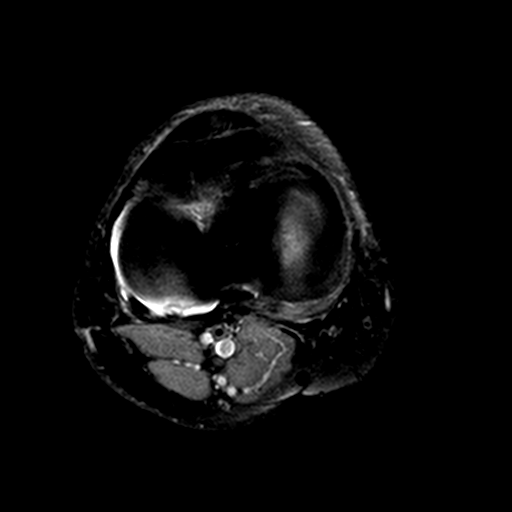
[im 18/35]
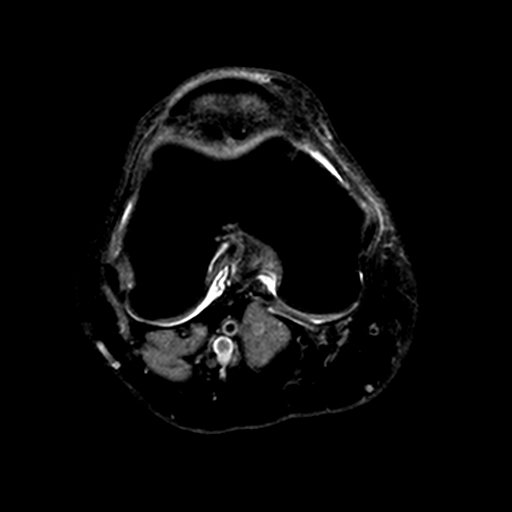
[im 22/35]
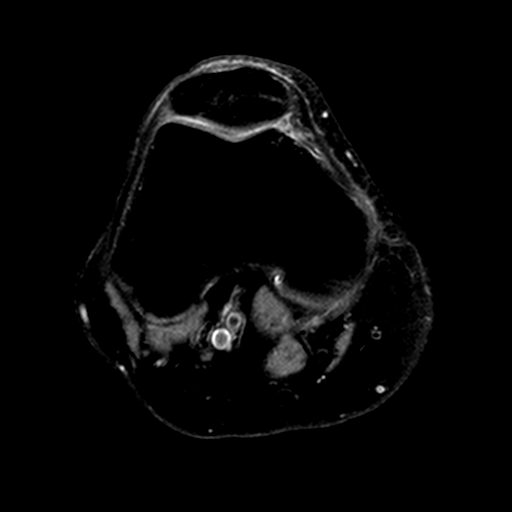
[im 26/35]
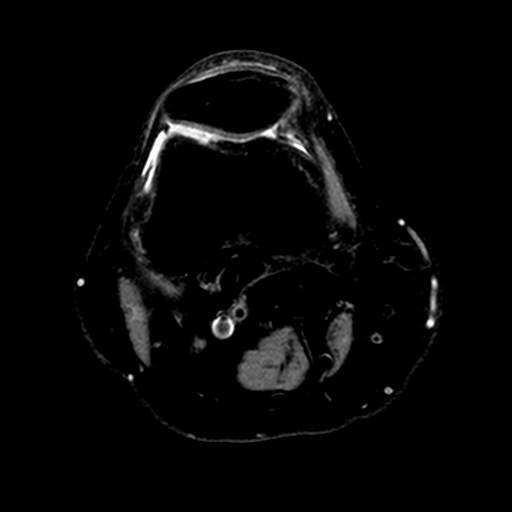
[im 30/35]
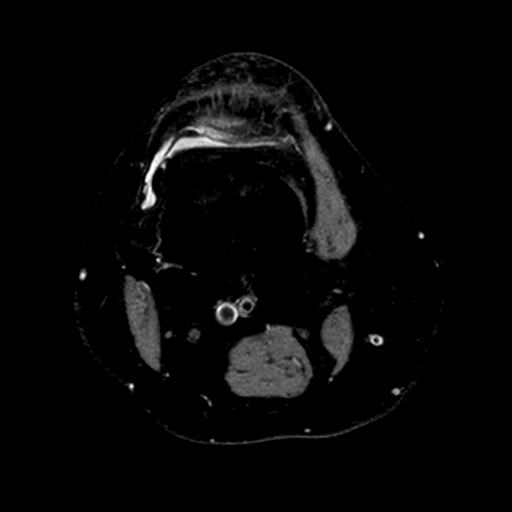
[im 35/35]
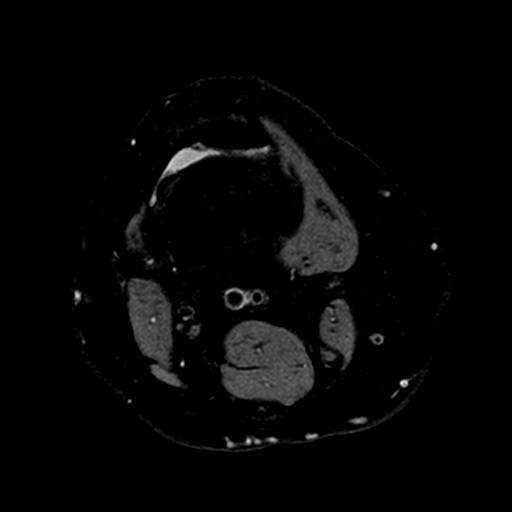

[Series 4: T1 · coronal · 3.0mm · 0.62mm/px · 6 of 29 slices shown]
[im 1/29]
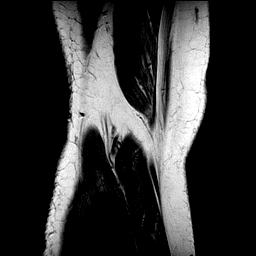
[im 6/29]
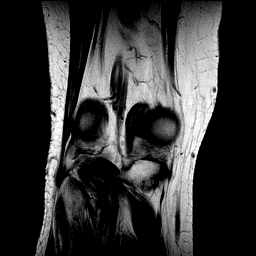
[im 12/29]
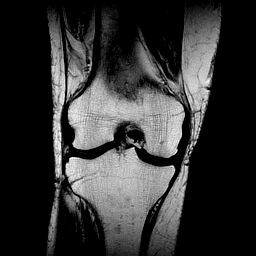
[im 17/29]
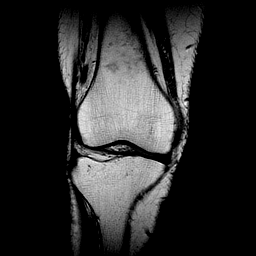
[im 23/29]
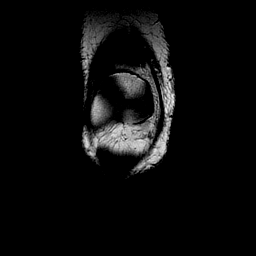
[im 29/29]
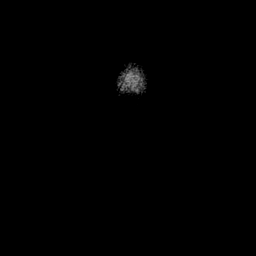

[Series 5: PD fat-sat · sagittal · 3.0mm · 0.62mm/px · 6 of 29 slices shown (2 of 3)]
[im 1/29]
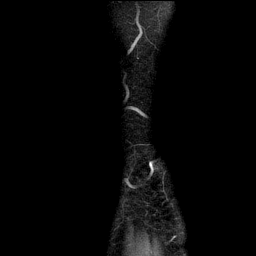
[im 6/29]
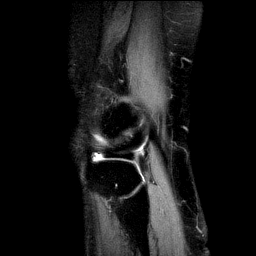
[im 12/29]
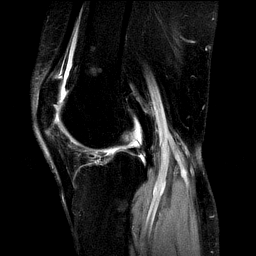
[im 17/29]
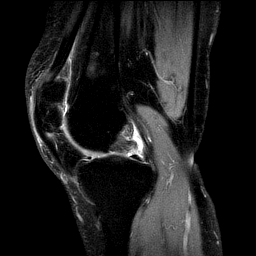
[im 23/29]
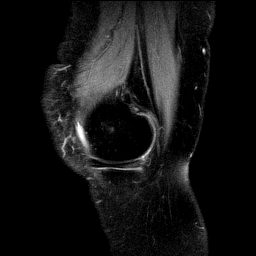
[im 29/29]
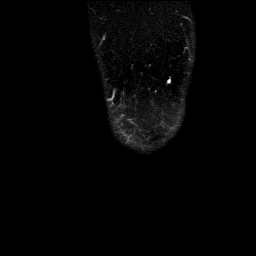

[Series 6: T2 fat-sat · coronal · 3.0mm · 0.62mm/px · 6 of 29 slices shown]
[im 1/29]
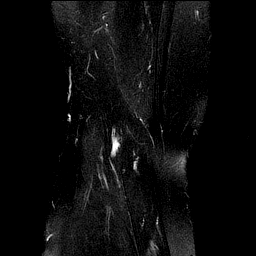
[im 6/29]
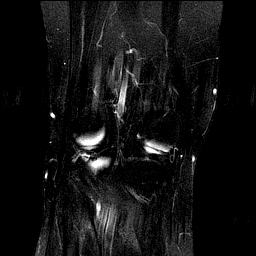
[im 12/29]
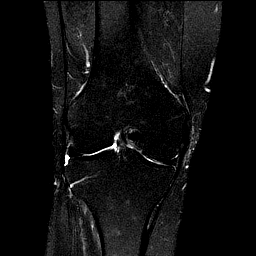
[im 17/29]
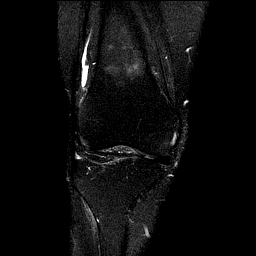
[im 23/29]
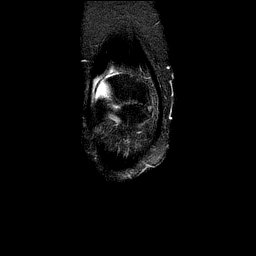
[im 29/29]
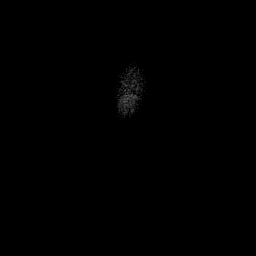

[Series 7: PD fat-sat · coronal · 3.0mm · 0.62mm/px · 6 of 29 slices shown (3 of 3)]
[im 1/29]
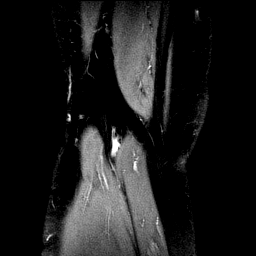
[im 6/29]
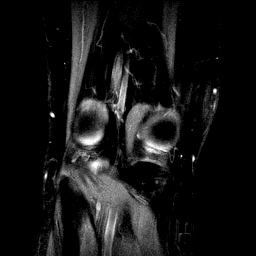
[im 12/29]
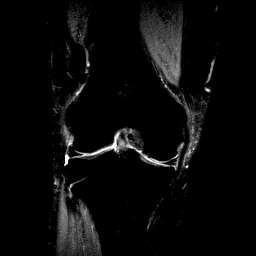
[im 17/29]
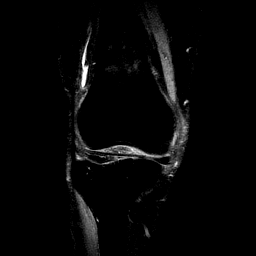
[im 23/29]
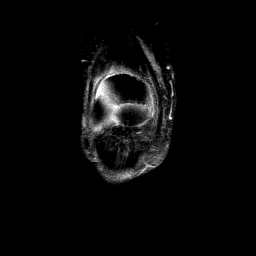
[im 29/29]
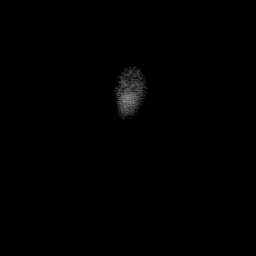

[Series 8: STIR · sagittal · 3.0mm · 0.62mm/px · 7 of 30 slices shown]
[im 1/30]
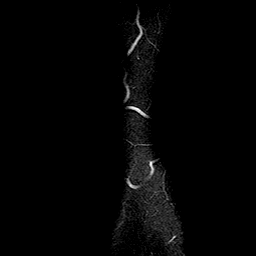
[im 5/30]
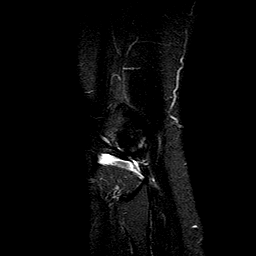
[im 10/30]
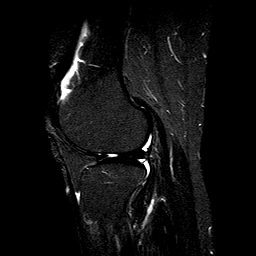
[im 15/30]
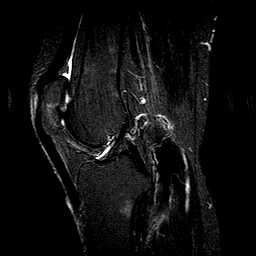
[im 20/30]
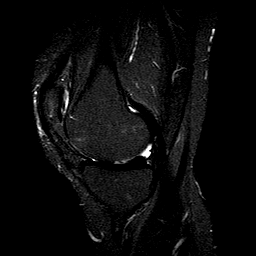
[im 25/30]
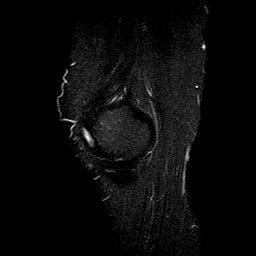
[im 30/30]
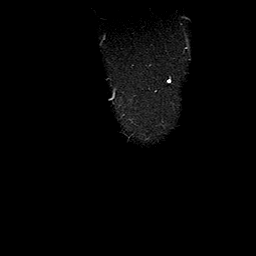

[40 of 40 positions shown; findings below may reference images not displayed]

FINDINGS: MENISCI

Medial meniscus:  Mild meniscal degeneration without tear.

Lateral meniscus:  Intact with normal morphology.

LIGAMENTS

Cruciates:  Intact.

Collaterals: The medial collateral ligament appears mildly thickened
and edematous proximally, consistent with a partial tear or
degeneration. There is no full-thickness tear or retraction. The
lateral collateral ligament complex appears unremarkable.

CARTILAGE

Patellofemoral: There is some irregularity of the cartilage over the
lateral patellar facet adjacent to the healed fracture. No
full-thickness chondral defects seen. The trochlear cartilage
appears intact.

Medial: Mild chondral thinning and surface irregularity without
full-thickness defect.

Lateral:  Preserved.

MISCELLANEOUS

Joint:  No significant joint effusion.

Popliteal Fossa:  Unremarkable. No significant Baker's cyst.

Extensor Mechanism:  Intact.

Bones: The previously demonstrated mildly comminuted transverse
patellar fracture appears largely healed. The fracture lines remain
visible on the posterior coronal images. There is anterior bridging
bone and no significant residual edema. No acute osseous findings
are demonstrated.

Other: No significant periarticular soft tissue findings.
IMPRESSION: 1. The previously demonstrated patellar fracture appears largely
healed with bridging bone anteriorly. No acute findings are seen.
2. Proximal MCL degeneration or partial tearing.
3. The menisci and cruciate ligaments appear intact.
4. Mild patellofemoral and medial compartment degenerative
chondrosis.
